# Patient Record
Sex: Male | Born: 1980 | Race: Black or African American | Hispanic: No | Marital: Single | Smoking: Current every day smoker
Health system: Southern US, Community
[De-identification: ages and names within clinical notes are randomized; demographics above are authoritative.]

## PROBLEM LIST (undated history)

## (undated) DIAGNOSIS — M5430 Sciatica, unspecified side: Secondary | ICD-10-CM

---

## 2007-11-18 ENCOUNTER — Emergency Department (HOSPITAL_COMMUNITY): Admission: EM | Admit: 2007-11-18 | Discharge: 2007-11-18 | Payer: Self-pay | Admitting: Emergency Medicine

## 2008-07-18 ENCOUNTER — Emergency Department (HOSPITAL_COMMUNITY): Admission: EM | Admit: 2008-07-18 | Discharge: 2008-07-18 | Payer: Self-pay | Admitting: Emergency Medicine

## 2008-07-20 ENCOUNTER — Emergency Department (HOSPITAL_COMMUNITY): Admission: EM | Admit: 2008-07-20 | Discharge: 2008-07-20 | Payer: Self-pay | Admitting: Emergency Medicine

## 2009-03-08 ENCOUNTER — Emergency Department (HOSPITAL_COMMUNITY): Admission: EM | Admit: 2009-03-08 | Discharge: 2009-03-08 | Payer: Self-pay | Admitting: Emergency Medicine

## 2009-03-10 ENCOUNTER — Emergency Department (HOSPITAL_COMMUNITY): Admission: EM | Admit: 2009-03-10 | Discharge: 2009-03-10 | Payer: Self-pay | Admitting: Emergency Medicine

## 2011-05-30 ENCOUNTER — Encounter (HOSPITAL_COMMUNITY): Payer: Self-pay | Admitting: Emergency Medicine

## 2011-05-30 ENCOUNTER — Emergency Department (HOSPITAL_COMMUNITY)
Admission: EM | Admit: 2011-05-30 | Discharge: 2011-05-30 | Disposition: A | Discharge status: Home / Self Care | Payer: Self-pay | Attending: Emergency Medicine | Admitting: Emergency Medicine

## 2011-05-30 DIAGNOSIS — L089 Local infection of the skin and subcutaneous tissue, unspecified: Secondary | ICD-10-CM | POA: Insufficient documentation

## 2011-05-30 DIAGNOSIS — F17200 Nicotine dependence, unspecified, uncomplicated: Secondary | ICD-10-CM | POA: Insufficient documentation

## 2011-05-30 MED ORDER — IBUPROFEN 600 MG PO TABS: 600.0000 mg | ORAL_TABLET | Freq: Four times a day (QID) | ORAL | Status: AC | PRN

## 2011-05-30 MED ORDER — DOXYCYCLINE HYCLATE 100 MG PO CAPS: 100.0000 mg | ORAL_CAPSULE | Freq: Two times a day (BID) | ORAL | Status: AC

## 2011-05-30 NOTE — ED Provider Notes (Signed)
History     CSN: [id number]  Arrival date & time 05/30/11  1635   First MD Initiated Contact with Patient 05/30/11 1703      Chief Complaint  Patient presents with  . Abscess    (Consider location/radiation/quality/duration/timing/severity/associated sxs/prior treatment) Patient is a 31 y.o. male presenting with abscess. The history is provided by the patient.  Abscess  The current episode started yesterday (He woke with a pimple on his left dorsal middle finger yesterday.  He squeezed the area,  a small amount of clear fluid was expressed,  now has increased pain.). The problem occurs continuously. The problem has been unchanged. The abscess is present on the left fingers. The problem is moderate. The abscess is characterized by redness, painfulness and swelling. Pertinent negatives include no fever and no sore throat. Associated symptoms comments: No numbness or weakness.. He has received no recent medical care.    History reviewed. No pertinent past medical history.  History reviewed. No pertinent past surgical history.  No family history on file.  History  Substance Use Topics  . Smoking status: Current Everyday Smoker    Types: Cigarettes  . Smokeless tobacco: Not on file  . Alcohol Use: Yes      Review of Systems  Constitutional: Negative for fever.  HENT: Negative.  Negative for sore throat.   Eyes: Negative.   Respiratory: Negative.   Cardiovascular: Negative.   Gastrointestinal: Negative.   Genitourinary: Negative.   Musculoskeletal: Negative for arthralgias.  Skin: Positive for wound. Negative for rash.  Neurological: Negative for weakness and numbness.  Hematological: Negative.   Psychiatric/Behavioral: Negative.     Allergies  Review of patient's allergies indicates no known allergies.  Home Medications   Current Outpatient Rx  Name Route Sig Dispense Refill  . DOXYCYCLINE HYCLATE 100 MG PO CAPS Oral Take 1 capsule (100 mg total) by mouth 2 (two)  times daily. 20 capsule 0  . IBUPROFEN 600 MG PO TABS Oral Take 1 tablet (600 mg total) by mouth every 6 (six) hours as needed for pain. 30 tablet 0    BP 130/77  Pulse 86  Temp(Src) 99.4 F (37.4 C) (Oral)  Resp 18  Ht 5\' 6"  (1.676 m)  Wt 160 lb (72.576 kg)  BMI 25.82 kg/m2  SpO2 99%  Physical Exam  Nursing note and vitals reviewed. Constitutional: He is oriented to person, place, and time. He appears well-developed and well-nourished.  HENT:  Head: Normocephalic and atraumatic.  Eyes: Conjunctivae are normal.  Neck: Normal range of motion.  Cardiovascular: Normal rate, regular rhythm and intact distal pulses.   Pulmonary/Chest: Effort normal.  Musculoskeletal: Normal range of motion.  Neurological: He is alert and oriented to person, place, and time.  Skin: Skin is warm and dry.       0.3 cm papule left dorsal mid proximal phalanx of left middle finger.  No red streaking,  No drainage or fluctuance.  No tenting.  Cap refill less than 3 sec.    Psychiatric: He has a normal mood and affect.    ED Course  Procedures (including critical care time)  Labs Reviewed - No data to display No results found.   1. Finger infection       MDM  Doxycyline.  Ibuprofen,  Epsom salt soaks.  Medical screening examination/treatment/procedure(s) were performed by non-physician practitioner and as supervising physician I was immediately available for consultation/collaboration. Osvaldo Human, M.D.       Candis Musa, PA 05/30/11 1729  Carleene Cooper III, MD 05/31/11 [phone]

## 2011-05-30 NOTE — Discharge Instructions (Signed)
Skin Infections A skin infection usually develops as a result of disruption of the skin barrier.  CAUSES  A skin infection might occur following:  Trauma or an injury to the skin such as a cut or insect sting.   Inflammation (as in eczema).   Breaks in the skin between the toes (as in athlete's foot).   Swelling (edema).  SYMPTOMS  The legs are the most common site affected. Usually there is:  Redness.   Swelling.   Pain.   There may be red streaks in the area of the infection.  TREATMENT   Minor skin infections may be treated with topical antibiotics, but if the skin infection is severe, hospital care and intravenous (IV) antibiotic treatment may be needed.   Most often skin infections can be treated with oral antibiotic medicine as well as proper rest and elevation of the affected area until the infection improves.   If you are prescribed oral antibiotics, it is important to take them as directed and to take all the pills even if you feel better before you have finished all of the medicine.   You may apply warm compresses to the area for 20-30 minutes 4 times daily.  You might need a tetanus shot now if:  You have no idea when you had the last one.   You have never had a tetanus shot before.   Your wound had dirt in it.  If you need a tetanus shot and you decide not to get one, there is a rare chance of getting tetanus. Sickness from tetanus can be serious. If you get a tetanus shot, your arm may swell and become red and warm at the shot site. This is common and not a problem. SEEK MEDICAL CARE IF:  The pain and swelling from your infection do not improve within 2 days.  SEEK IMMEDIATE MEDICAL CARE IF:  You develop a fever, chills, or other serious problems.  Document Released: 05/05/2004 Document Revised: 12/08/2010 Document Reviewed: 03/17/2008 [facility] Patient Information 2012 [place], [place].   Use the doxycycline until gone.  Use the ibuprofen for pain and  swelling.  Warm epsom salt soaks for 15 minutes 3-4 times daily.   Return here for a recheck for any worsened swelling or redness, as discussed.

## 2011-05-30 NOTE — ED Notes (Signed)
Red area to proximal phalanx LMF.  Started as a "pimple " that he "squeezed". After that area started to swell and cause pain up his arm.  Sl swelling to prox phalanx, no drainage.  No red streaks.

## 2011-05-30 NOTE — ED Notes (Signed)
Pt c/o abscess to the left middle finger.

## 2012-01-03 ENCOUNTER — Emergency Department (HOSPITAL_COMMUNITY)
Admission: EM | Admit: 2012-01-03 | Discharge: 2012-01-03 | Disposition: A | Discharge status: Home / Self Care | Payer: Self-pay | Attending: Emergency Medicine | Admitting: Emergency Medicine

## 2012-01-03 ENCOUNTER — Encounter (HOSPITAL_COMMUNITY): Payer: Self-pay

## 2012-01-03 DIAGNOSIS — IMO0002 Reserved for concepts with insufficient information to code with codable children: Secondary | ICD-10-CM | POA: Insufficient documentation

## 2012-01-03 DIAGNOSIS — F17200 Nicotine dependence, unspecified, uncomplicated: Secondary | ICD-10-CM | POA: Insufficient documentation

## 2012-01-03 DIAGNOSIS — M5416 Radiculopathy, lumbar region: Secondary | ICD-10-CM

## 2012-01-03 MED ORDER — PREDNISONE 50 MG PO TABS: 50.0000 mg | ORAL_TABLET | Freq: Every day | ORAL | Status: DC

## 2012-01-03 MED ORDER — OXYCODONE-ACETAMINOPHEN 5-325 MG PO TABS: ORAL_TABLET | ORAL | Status: DC

## 2012-01-03 MED ORDER — OXYCODONE-ACETAMINOPHEN 5-325 MG PO TABS
1.0000 | ORAL_TABLET | Freq: Once | ORAL | Status: AC
  Administered 2012-01-03: 1 via ORAL
  Filled 2012-01-03: qty 1

## 2012-01-03 MED ORDER — PREDNISONE 20 MG PO TABS
60.0000 mg | ORAL_TABLET | Freq: Once | ORAL | Status: AC
  Administered 2012-01-03: 60 mg via ORAL
  Filled 2012-01-03: qty 3

## 2012-01-03 NOTE — ED Notes (Signed)
Pt reports woke up with r hip pain Saturday morning.  Denies injury, says pain is getting worse each day.

## 2012-01-03 NOTE — ED Notes (Signed)
Alert, Nad, Pain rt hip, buttock since Saturday, No known injury. Says he has had problems with this back in the past.

## 2012-01-03 NOTE — ED Provider Notes (Signed)
History     CSN: [id number]  Arrival date & time 01/03/12  1229   First MD Initiated Contact with Patient 01/03/12 1321      Chief Complaint  Patient presents with  . Hip Pain    (Consider location/radiation/quality/duration/timing/severity/associated sxs/prior treatment) HPI Comments: Pt has chronic back pain.  He awakened a few days ago with "R hip pain".  He actually points to the R piriformis muscle area.  No known injury.  The history is provided by the patient. No language interpreter was used.    History reviewed. No pertinent past medical history.  History reviewed. No pertinent past surgical history.  No family history on file.  History  Substance Use Topics  . Smoking status: Current Every Day Smoker    Types: Cigarettes  . Smokeless tobacco: Not on file  . Alcohol Use: Yes      Review of Systems  Constitutional: Negative for fever and chills.  Musculoskeletal:       R buttock pain   Neurological: Negative for weakness and numbness.  All other systems reviewed and are negative.    Allergies  Onion and Peanut-containing drug products  Home Medications   Current Outpatient Rx  Name Route Sig Dispense Refill  . BIOFREEZE EX Apply externally Apply 1 application topically daily as needed. Pain    . OXYCODONE-ACETAMINOPHEN 5-325 MG PO TABS  One po q 4-6 hrs prn pain 20 tablet 0  . PREDNISONE 50 MG PO TABS Oral Take 1 tablet (50 mg total) by mouth daily. 6 tablet 0    BP 148/86  Pulse 69  Temp 98.2 F (36.8 C) (Oral)  Resp 20  Ht 5\' 7"  (1.702 m)  Wt 165 lb (74.844 kg)  BMI 25.84 kg/m2  SpO2 99%  Physical Exam  Nursing note and vitals reviewed. Constitutional: He is oriented to person, place, and time. He appears well-developed and well-nourished.  HENT:  Head: Normocephalic and atraumatic.  Eyes: EOM are normal.  Neck: Normal range of motion.  Cardiovascular: Normal rate, regular rhythm, normal heart sounds and intact distal pulses.     Pulmonary/Chest: Effort normal and breath sounds normal. No respiratory distress.  Abdominal: Soft. He exhibits no distension. There is no tenderness.  Musculoskeletal: He exhibits tenderness.       Back:  Neurological: He is alert and oriented to person, place, and time.  Skin: Skin is warm and dry.  Psychiatric: He has a normal mood and affect. Judgment normal.    ED Course  Procedures (including critical care time)  Labs Reviewed - No data to display No results found.   1. Lumbar radiculopathy, acute       MDM  ice rx-prednisone 50 mg, 6 rx-percocet, 20 F/u with dr. Hilda Lias prn        Evalina Field, PA 01/03/12 1353

## 2012-01-03 NOTE — ED Provider Notes (Signed)
Medical screening examination/treatment/procedure(s) were performed by non-physician practitioner and as supervising physician I was immediately available for consultation/collaboration.   Benny Lennert, MD 01/03/12 1539

## 2012-01-10 ENCOUNTER — Emergency Department (HOSPITAL_COMMUNITY)
Admission: EM | Admit: 2012-01-10 | Discharge: 2012-01-10 | Disposition: A | Discharge status: Home / Self Care | Payer: Self-pay | Attending: Emergency Medicine | Admitting: Emergency Medicine

## 2012-01-10 ENCOUNTER — Encounter (HOSPITAL_COMMUNITY): Payer: Self-pay | Admitting: *Deleted

## 2012-01-10 DIAGNOSIS — F17200 Nicotine dependence, unspecified, uncomplicated: Secondary | ICD-10-CM | POA: Insufficient documentation

## 2012-01-10 DIAGNOSIS — M5430 Sciatica, unspecified side: Secondary | ICD-10-CM | POA: Insufficient documentation

## 2012-01-10 LAB — URINALYSIS, ROUTINE W REFLEX MICROSCOPIC
Bilirubin Urine: NEGATIVE
Hgb urine dipstick: NEGATIVE
Protein, ur: NEGATIVE mg/dL
Specific Gravity, Urine: <1.005 — ABNORMAL LOW (ref 1.005–1.030)
Urobilinogen, UA: 0.2 mg/dL (ref 0.0–1.0)

## 2012-01-10 MED ORDER — HYDROMORPHONE HCL PF 1 MG/ML IJ SOLN
1.0000 mg | Freq: Once | INTRAMUSCULAR | Status: AC
  Administered 2012-01-10: 1 mg via INTRAMUSCULAR
  Filled 2012-01-10: qty 1

## 2012-01-10 MED ORDER — NAPROXEN 500 MG PO TABS: 500.0000 mg | ORAL_TABLET | Freq: Two times a day (BID) | ORAL | Status: DC

## 2012-01-10 MED ORDER — TRAMADOL HCL 50 MG PO TABS: 50.0000 mg | ORAL_TABLET | Freq: Four times a day (QID) | ORAL | Status: DC | PRN

## 2012-01-10 MED ORDER — ACETAMINOPHEN 325 MG PO TABS
650.0000 mg | ORAL_TABLET | Freq: Once | ORAL | Status: AC
  Administered 2012-01-10: 650 mg via ORAL
  Filled 2012-01-10: qty 2

## 2012-01-10 NOTE — ED Notes (Signed)
Pt c/o right hip and lower back pain x1 week. Pt was seen here previously but symptoms have gotten worse. Pt also presents with flu-like symptoms.

## 2012-01-10 NOTE — ED Notes (Addendum)
Pt reports being seen a week ago for hip and back pain.  Reports no relief in symptoms, despite medications.  Also reporting cold symptoms, generalized aches x2 days.
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
## 2012-01-22 ENCOUNTER — Encounter (HOSPITAL_COMMUNITY): Payer: Self-pay | Admitting: Emergency Medicine

## 2012-01-22 ENCOUNTER — Emergency Department (HOSPITAL_COMMUNITY)
Admission: EM | Admit: 2012-01-22 | Discharge: 2012-01-22 | Disposition: A | Discharge status: Home / Self Care | Payer: Self-pay | Attending: Emergency Medicine | Admitting: Emergency Medicine

## 2012-01-22 DIAGNOSIS — M5431 Sciatica, right side: Secondary | ICD-10-CM

## 2012-01-22 DIAGNOSIS — Z79899 Other long term (current) drug therapy: Secondary | ICD-10-CM | POA: Insufficient documentation

## 2012-01-22 DIAGNOSIS — M25579 Pain in unspecified ankle and joints of unspecified foot: Secondary | ICD-10-CM | POA: Insufficient documentation

## 2012-01-22 DIAGNOSIS — Z91018 Allergy to other foods: Secondary | ICD-10-CM | POA: Insufficient documentation

## 2012-01-22 DIAGNOSIS — M5430 Sciatica, unspecified side: Secondary | ICD-10-CM | POA: Insufficient documentation

## 2012-01-22 DIAGNOSIS — F17200 Nicotine dependence, unspecified, uncomplicated: Secondary | ICD-10-CM | POA: Insufficient documentation

## 2012-01-22 LAB — GLUCOSE, CAPILLARY

## 2012-01-22 MED ORDER — HYDROCODONE-ACETAMINOPHEN 5-325 MG PO TABS: 1.0000 | ORAL_TABLET | Freq: Four times a day (QID) | ORAL | Status: AC | PRN

## 2012-01-22 MED ORDER — PREDNISONE 20 MG PO TABS
60.0000 mg | ORAL_TABLET | Freq: Once | ORAL | Status: AC
  Administered 2012-01-22: 60 mg via ORAL
  Filled 2012-01-22: qty 3

## 2012-01-22 MED ORDER — HYDROCODONE-ACETAMINOPHEN 5-325 MG PO TABS
1.0000 | ORAL_TABLET | Freq: Once | ORAL | Status: AC
  Administered 2012-01-22: 1 via ORAL
  Filled 2012-01-22: qty 1

## 2012-01-22 NOTE — ED Provider Notes (Signed)
Medical screening examination/treatment/procedure(s) were performed by non-physician practitioner and as supervising physician I was immediately available for consultation/collaboration.  Vali Capano, MD 01/22/12 2349 

## 2012-01-22 NOTE — ED Provider Notes (Signed)
History     CSN: [id number]  Arrival date & time 01/22/12  1510   First MD Initiated Contact with Patient 01/22/12 1520      Chief Complaint  Patient presents with  . Back Pain    (Consider location/radiation/quality/duration/timing/severity/associated sxs/prior treatment) HPI Comments: Seen here several times with same complaint.  States he stopped taking the prednisone i wrote for him recently after taking 1 dose "because it was making things hurt that never hurt befre".  Now having radiation to R foot.    Wants orthopedic referral.  Patient is a 31 y.o. male presenting with back pain. The history is provided by the patient. No language interpreter was used.  Back Pain  This is a chronic problem. The problem occurs constantly. The problem has been gradually worsening. The pain is associated with no known injury. The pain is present in the lumbar spine. The quality of the pain is described as burning and aching. The pain radiates to the right foot. The pain is severe. The pain is the same all the time. Associated symptoms include numbness. Pertinent negatives include no fever and no weakness. Treatments tried: tramadol and naproxen. The treatment provided no relief.    History reviewed. No pertinent past medical history.  History reviewed. No pertinent past surgical history.  No family history on file.  History  Substance Use Topics  . Smoking status: Current Every Day Smoker    Types: Cigarettes  . Smokeless tobacco: Not on file  . Alcohol Use: Yes      Review of Systems  Constitutional: Negative for fever and chills.  Musculoskeletal: Positive for back pain.  Neurological: Positive for numbness. Negative for weakness.  All other systems reviewed and are negative.    Allergies  Onion and Peanut-containing drug products  Home Medications   Current Outpatient Rx  Name Route Sig Dispense Refill  . NAPROXEN 500 MG PO TABS Oral Take 1 tablet (500 mg total) by mouth  2 (two) times daily. 30 tablet 0  . TRAMADOL HCL 50 MG PO TABS Oral Take 1 tablet (50 mg total) by mouth every 6 (six) hours as needed for pain. 30 tablet 0  . HYDROCODONE-ACETAMINOPHEN 5-325 MG PO TABS Oral Take 1 tablet by mouth every 6 (six) hours as needed for pain. 20 tablet 0    BP 110/74  Pulse 62  Temp 98.4 F (36.9 C) (Oral)  Resp 20  Ht 5\' 7"  (1.702 m)  Wt 160 lb (72.576 kg)  BMI 25.06 kg/m2  SpO2 100%  Physical Exam  Nursing note and vitals reviewed. Constitutional: He is oriented to person, place, and time. He appears well-developed and well-nourished.  HENT:  Head: Normocephalic and atraumatic.  Eyes: EOM are normal.  Neck: Normal range of motion.  Cardiovascular: Normal rate, regular rhythm, normal heart sounds and intact distal pulses.   Pulmonary/Chest: Effort normal and breath sounds normal. No respiratory distress.  Abdominal: Soft. He exhibits no distension. There is no tenderness.  Musculoskeletal: Normal range of motion. He exhibits tenderness.       Back:  Neurological: He is alert and oriented to person, place, and time.  Skin: Skin is warm and dry.  Psychiatric: He has a normal mood and affect. Judgment normal.    ED Course  Procedures (including critical care time)   Labs Reviewed  GLUCOSE, CAPILLARY   No results found.   1. Right sided sciatica       MDM  rx-hydrocodone, 20 Has prednisone 50 mg  x 4 days Ice F/u with dr. Hilda Lias or dr. Romeo Apple.        Evalina Field, [place] 01/22/12 1615

## 2012-01-22 NOTE — ED Notes (Signed)
Pt c/o lower back pain radiating down right leg. H/s sciatica.

## 2012-04-07 ENCOUNTER — Emergency Department (HOSPITAL_COMMUNITY)
Admission: EM | Admit: 2012-04-07 | Discharge: 2012-04-07 | Disposition: A | Discharge status: Home / Self Care | Payer: Self-pay | Attending: Emergency Medicine | Admitting: Emergency Medicine

## 2012-04-07 ENCOUNTER — Encounter (HOSPITAL_COMMUNITY): Payer: Self-pay

## 2012-04-07 DIAGNOSIS — R209 Unspecified disturbances of skin sensation: Secondary | ICD-10-CM | POA: Insufficient documentation

## 2012-04-07 DIAGNOSIS — G8929 Other chronic pain: Secondary | ICD-10-CM | POA: Insufficient documentation

## 2012-04-07 DIAGNOSIS — M5416 Radiculopathy, lumbar region: Secondary | ICD-10-CM

## 2012-04-07 DIAGNOSIS — M5430 Sciatica, unspecified side: Secondary | ICD-10-CM | POA: Insufficient documentation

## 2012-04-07 DIAGNOSIS — IMO0002 Reserved for concepts with insufficient information to code with codable children: Secondary | ICD-10-CM | POA: Insufficient documentation

## 2012-04-07 DIAGNOSIS — F17200 Nicotine dependence, unspecified, uncomplicated: Secondary | ICD-10-CM | POA: Insufficient documentation

## 2012-04-07 HISTORY — DX: Sciatica, unspecified side: M54.30

## 2012-04-07 MED ORDER — CYCLOBENZAPRINE HCL 10 MG PO TABS: 10.0000 mg | ORAL_TABLET | Freq: Three times a day (TID) | ORAL | Status: DC | PRN

## 2012-04-07 MED ORDER — CYCLOBENZAPRINE HCL 10 MG PO TABS
10.0000 mg | ORAL_TABLET | Freq: Once | ORAL | Status: AC
  Administered 2012-04-07: 10 mg via ORAL
  Filled 2012-04-07: qty 1

## 2012-04-07 MED ORDER — HYDROCODONE-ACETAMINOPHEN 5-325 MG PO TABS
1.0000 | ORAL_TABLET | Freq: Once | ORAL | Status: AC
  Administered 2012-04-07: 1 via ORAL
  Filled 2012-04-07: qty 1

## 2012-04-07 MED ORDER — PREDNISONE 10 MG PO TABS: ORAL_TABLET | ORAL | Status: DC

## 2012-04-07 MED ORDER — HYDROCODONE-ACETAMINOPHEN 5-325 MG PO TABS: ORAL_TABLET | ORAL | Status: DC

## 2012-04-07 NOTE — ED Provider Notes (Signed)
History     CSN: [id number]  Arrival date & time 04/07/12  1443   First MD Initiated Contact with Patient 04/07/12 1701      Chief Complaint  Patient presents with  . Back Pain    (Consider location/radiation/quality/duration/timing/severity/associated sxs/prior treatment) HPI Comments: Patient c/o worsening of his chronic low back pain for several months.  States he has been diagnosed with sciatica and prescribed pain medication which he states he is out of.  States the back pain is worsening and describes having sharp, intermittent pains radiating down his right leg to his foot.  Pain is worse with certain movements.  He denies recent injury, incontinence, saddle anesthesia's, dysuria, or abdominal pain  Patient is a 31 y.o. male presenting with back pain. The history is provided by the patient.  Back Pain  This is a chronic problem. The current episode started more than 1 week ago. The problem occurs constantly. The problem has been gradually worsening. The pain is associated with lifting heavy objects and twisting. The pain is present in the lumbar spine. The quality of the pain is described as aching and shooting. The pain radiates to the right thigh, right knee and right foot. The pain is mild. The symptoms are aggravated by bending, twisting and certain positions. The pain is the same all the time. Associated symptoms include leg pain and tingling. Pertinent negatives include no chest pain, no fever, no numbness, no abdominal pain, no abdominal swelling, no bowel incontinence, no perianal numbness, no bladder incontinence, no dysuria, no pelvic pain, no paresthesias, no paresis and no weakness. He has tried analgesics for the symptoms. The treatment provided moderate relief.    Past Medical History  Diagnosis Date  . Chronic sciatica     History reviewed. No pertinent past surgical history.  No family history on file.  History  Substance Use Topics  . Smoking status: Current  Every Day Smoker    Types: Cigarettes  . Smokeless tobacco: Not on file  . Alcohol Use: Yes     Comment: occ      Review of Systems  Constitutional: Negative for fever.  Respiratory: Negative for shortness of breath.   Cardiovascular: Negative for chest pain.  Gastrointestinal: Negative for vomiting, abdominal pain, constipation and bowel incontinence.  Genitourinary: Negative for bladder incontinence, dysuria, urgency, hematuria, flank pain, decreased urine volume, discharge, difficulty urinating, penile pain, testicular pain and pelvic pain.       No perineal numbness or incontinence of urine or feces  Musculoskeletal: Positive for back pain. Negative for joint swelling.  Skin: Negative for rash.  Neurological: Positive for tingling. Negative for dizziness, weakness, numbness and paresthesias.  All other systems reviewed and are negative.    Allergies  Onion and Peanut-containing drug products  Home Medications   Current Outpatient Rx  Name  Route  Sig  Dispense  Refill  . NAPROXEN 500 MG PO TABS   Oral   Take 1 tablet (500 mg total) by mouth 2 (two) times daily.   30 tablet   0   . TRAMADOL HCL 50 MG PO TABS   Oral   Take 1 tablet (50 mg total) by mouth every 6 (six) hours as needed for pain.   30 tablet   0     BP 130/68  Pulse 88  Temp 98 F (36.7 C) (Oral)  Resp 18  Ht 5\' 6"  (1.676 m)  Wt 155 lb (70.308 kg)  BMI 25.02 kg/m2  SpO2 99%  Physical Exam  Nursing note and vitals reviewed. Constitutional: He is oriented to person, place, and time. He appears well-developed and well-nourished. No distress.  HENT:  Head: Normocephalic and atraumatic.  Mouth/Throat: Oropharynx is clear and moist.  Neck: Normal range of motion. Neck supple.  Cardiovascular: Normal rate, regular rhythm, normal heart sounds and intact distal pulses.   No murmur heard. Pulmonary/Chest: Effort normal and breath sounds normal. No respiratory distress. He exhibits no tenderness.    Abdominal: Soft. He exhibits no distension. There is no tenderness.  Musculoskeletal: Normal range of motion. He exhibits tenderness. He exhibits no edema.       Lumbar back: He exhibits tenderness and pain. He exhibits normal range of motion, no swelling, no deformity, no laceration and normal pulse.       Back:       ttp of the right lumbar paraspinal muscles and right si joint.  dp pulses are brisk, distal sensation intact.    Lymphadenopathy:    He has no cervical adenopathy.  Neurological: He is alert and oriented to person, place, and time. No sensory deficit. He exhibits normal muscle tone. Coordination and gait normal.  Reflex Scores:      Patellar reflexes are 2+ on the right side and 2+ on the left side.      Achilles reflexes are 2+ on the right side and 2+ on the left side. Skin: Skin is warm and dry.    ED Course  Procedures (including critical care time)  Labs Reviewed - No data to display No results found.      MDM    Previous ED chart reviewed.  Pt has ttp of the right lumbar paraspinal muscles and SI joint.  Pain reproduced with SLR on right.  No focal neuro deficits, ambulates with a steady gait.  Doubt emergent neurological or infectious process.  Pt given neuro referral.    Prescribed: Prednisone taper Flexeril norco #20  Lakeyta Vandenheuvel L. [place], [place] 04/09/12 2100

## 2012-04-07 NOTE — ED Notes (Signed)
Pt reports back pain off/on for years.  Afraid pain is going to get worse.  Out of meds for pain.  Since nov.

## 2012-04-09 NOTE — ED Provider Notes (Signed)
Medical screening examination/treatment/procedure(s) were performed by non-physician practitioner and as supervising physician I was immediately available for consultation/collaboration.  Kinzee Happel, MD 04/09/12 2112 

## 2012-05-21 ENCOUNTER — Other Ambulatory Visit (HOSPITAL_COMMUNITY): Payer: Self-pay | Admitting: Neurosurgery

## 2012-05-21 DIAGNOSIS — M545 Low back pain: Secondary | ICD-10-CM

## 2012-05-24 ENCOUNTER — Ambulatory Visit (HOSPITAL_COMMUNITY): Payer: Self-pay

## 2012-05-30 ENCOUNTER — Ambulatory Visit (HOSPITAL_COMMUNITY)
Admission: RE | Admit: 2012-05-30 | Discharge: 2012-05-30 | Disposition: A | Discharge status: Home / Self Care | Payer: Self-pay | Source: Ambulatory Visit | Attending: Neurosurgery | Admitting: Neurosurgery

## 2012-05-30 DIAGNOSIS — M5126 Other intervertebral disc displacement, lumbar region: Secondary | ICD-10-CM | POA: Insufficient documentation

## 2012-05-30 DIAGNOSIS — R209 Unspecified disturbances of skin sensation: Secondary | ICD-10-CM | POA: Insufficient documentation

## 2012-05-30 DIAGNOSIS — M545 Low back pain, unspecified: Secondary | ICD-10-CM | POA: Insufficient documentation

## 2012-08-24 ENCOUNTER — Encounter (HOSPITAL_COMMUNITY): Payer: Self-pay | Admitting: *Deleted

## 2012-08-24 ENCOUNTER — Emergency Department (HOSPITAL_COMMUNITY)
Admission: EM | Admit: 2012-08-24 | Discharge: 2012-08-24 | Disposition: A | Discharge status: Home / Self Care | Payer: Self-pay | Attending: Emergency Medicine | Admitting: Emergency Medicine

## 2012-08-24 DIAGNOSIS — Z79899 Other long term (current) drug therapy: Secondary | ICD-10-CM | POA: Insufficient documentation

## 2012-08-24 DIAGNOSIS — F17200 Nicotine dependence, unspecified, uncomplicated: Secondary | ICD-10-CM | POA: Insufficient documentation

## 2012-08-24 DIAGNOSIS — Z8739 Personal history of other diseases of the musculoskeletal system and connective tissue: Secondary | ICD-10-CM | POA: Insufficient documentation

## 2012-08-24 DIAGNOSIS — K089 Disorder of teeth and supporting structures, unspecified: Secondary | ICD-10-CM | POA: Insufficient documentation

## 2012-08-24 DIAGNOSIS — K0889 Other specified disorders of teeth and supporting structures: Secondary | ICD-10-CM

## 2012-08-24 MED ORDER — DICLOFENAC SODIUM 75 MG PO TBEC: 75.0000 mg | DELAYED_RELEASE_TABLET | Freq: Two times a day (BID) | ORAL | Status: AC

## 2012-08-24 MED ORDER — HYDROCODONE-ACETAMINOPHEN 7.5-325 MG PO TABS: 1.0000 | ORAL_TABLET | ORAL | Status: DC | PRN

## 2012-08-24 NOTE — ED Provider Notes (Signed)
History     CSN: [id number]  Arrival date & time 08/24/12  1059   First MD Initiated Contact with Patient 08/24/12 1218      Chief Complaint  Patient presents with  . Dental Pain    (Consider location/radiation/quality/duration/timing/severity/associated sxs/prior treatment) Patient is a 32 y.o. male presenting with tooth pain. The history is provided by the patient.  Dental PainThe primary symptoms include mouth pain. Primary symptoms do not include shortness of breath or cough. The symptoms are worsening. The symptoms occur frequently.  Additional symptoms include: gum swelling and jaw pain. Additional symptoms do not include: trouble swallowing and nosebleeds. Medical issues include: smoking.    Past Medical History  Diagnosis Date  . Chronic sciatica     History reviewed. No pertinent past surgical history.  History reviewed. No pertinent family history.  History  Substance Use Topics  . Smoking status: Current Every Day Smoker    Types: Cigarettes  . Smokeless tobacco: Not on file  . Alcohol Use: Yes     Comment: occ      Review of Systems  Constitutional: Negative for activity change.       All ROS Neg except as noted in HPI  HENT: Positive for dental problem. Negative for nosebleeds, trouble swallowing and neck pain.   Eyes: Negative for photophobia and discharge.  Respiratory: Negative for cough, shortness of breath and wheezing.   Cardiovascular: Negative for chest pain and palpitations.  Gastrointestinal: Negative for abdominal pain and blood in stool.  Genitourinary: Negative for dysuria, frequency and hematuria.  Musculoskeletal: Negative for back pain and arthralgias.  Skin: Negative.   Neurological: Negative for dizziness, seizures and speech difficulty.  Psychiatric/Behavioral: Negative for hallucinations and confusion.    Allergies  Onion and Peanut-containing drug products  Home Medications   Current Outpatient Rx  Name  Route  Sig   Dispense  Refill  . HYDROcodone-acetaminophen (NORCO/VICODIN) 5-325 MG per tablet   Oral   Take 1 tablet by mouth every 6 (six) hours as needed for pain.         Marland Kitchen penicillin v potassium (VEETID) 500 MG tablet   Oral   Take 500 mg by mouth 4 (four) times daily.           BP 132/82  Pulse 91  Temp(Src) 98.6 F (37 C) (Oral)  Resp 18  Ht 5\' 6"  (1.676 m)  Wt 150 lb (68.04 kg)  BMI 24.22 kg/m2  SpO2 100%  Physical Exam  Nursing note and vitals reviewed. Constitutional: He is oriented to person, place, and time. He appears well-developed and well-nourished.  Non-toxic appearance.  HENT:  Head: Normocephalic.  Right Ear: Tympanic membrane and external ear normal.  Left Ear: Tympanic membrane and external ear normal.  Deep cavity of the right lower 1st molar. Mod swelling of the gum on the right.  No swelling under the tongue. Airway patent.  Eyes: EOM and lids are normal. Pupils are equal, round, and reactive to light.  Neck: Normal range of motion. Neck supple. Carotid bruit is not present.  Cardiovascular: Normal rate, regular rhythm, normal heart sounds, intact distal pulses and normal pulses.   Pulmonary/Chest: Breath sounds normal. No respiratory distress.  Abdominal: Soft. Bowel sounds are normal. There is no tenderness. There is no guarding.  Musculoskeletal: Normal range of motion.  Lymphadenopathy:       Head (right side): No submandibular adenopathy present.       Head (left side): No submandibular adenopathy present.  He has no cervical adenopathy.  Neurological: He is alert and oriented to person, place, and time. He has normal strength. No cranial nerve deficit or sensory deficit.  Skin: Skin is warm and dry.  Psychiatric: He has a normal mood and affect. His speech is normal.    ED Course  Procedures (including critical care time)  Labs Reviewed - No data to display No results found.   No diagnosis found.    MDM  I have reviewed nursing notes,  vital signs, and all appropriate lab and imaging results for this patient. Pt has a cavity that has been bothering him for a while. He is scheduled to have the tooth removed on Wed. 5/21. He states the pain is worse and request assistance with pain.  Rx for diclofenac and norco 7.5 given to the patient. Pt states he has penicillin 500mg  tabs.     Kathie Dike, PA-C 08/24/12 1233

## 2012-08-24 NOTE — ED Notes (Signed)
Dental pain for 3 days,  Supposed to have it pulled on Wednesday.

## 2012-08-27 NOTE — ED Provider Notes (Signed)
Medical screening examination/treatment/procedure(s) were performed by non-physician practitioner and as supervising physician I was immediately available for consultation/collaboration.   Kennen Stammer, MD 08/27/12 2207 

## 2013-04-12 ENCOUNTER — Emergency Department (HOSPITAL_COMMUNITY): Payer: Self-pay

## 2013-04-12 ENCOUNTER — Emergency Department (HOSPITAL_COMMUNITY)
Admission: EM | Admit: 2013-04-12 | Discharge: 2013-04-12 | Disposition: A | Discharge status: Home / Self Care | Payer: Self-pay | Attending: Emergency Medicine | Admitting: Emergency Medicine

## 2013-04-12 ENCOUNTER — Encounter (HOSPITAL_COMMUNITY): Payer: Self-pay | Admitting: Emergency Medicine

## 2013-04-12 DIAGNOSIS — F17200 Nicotine dependence, unspecified, uncomplicated: Secondary | ICD-10-CM | POA: Insufficient documentation

## 2013-04-12 DIAGNOSIS — Z792 Long term (current) use of antibiotics: Secondary | ICD-10-CM | POA: Insufficient documentation

## 2013-04-12 DIAGNOSIS — Z79899 Other long term (current) drug therapy: Secondary | ICD-10-CM | POA: Insufficient documentation

## 2013-04-12 DIAGNOSIS — Z8739 Personal history of other diseases of the musculoskeletal system and connective tissue: Secondary | ICD-10-CM | POA: Insufficient documentation

## 2013-04-12 DIAGNOSIS — R109 Unspecified abdominal pain: Secondary | ICD-10-CM

## 2013-04-12 DIAGNOSIS — R1012 Left upper quadrant pain: Secondary | ICD-10-CM | POA: Insufficient documentation

## 2013-04-12 LAB — CBC WITH DIFFERENTIAL/PLATELET
BASOS ABS: 0 10*3/uL (ref 0.0–0.1)
Basophils Relative: 0 % (ref 0–1)
EOS ABS: 0.1 10*3/uL (ref 0.0–0.7)
EOS PCT: 2 % (ref 0–5)
HCT: 42.6 % (ref 39.0–52.0)
Hemoglobin: 14.7 g/dL (ref 13.0–17.0)
LYMPHS ABS: 2.2 10*3/uL (ref 0.7–4.0)
LYMPHS PCT: 41 % (ref 12–46)
MCH: 32 pg (ref 26.0–34.0)
MCHC: 34.5 g/dL (ref 30.0–36.0)
MCV: 92.8 fL (ref 78.0–100.0)
Monocytes Absolute: 0.7 10*3/uL (ref 0.1–1.0)
Monocytes Relative: 13 % — ABNORMAL HIGH (ref 3–12)
NEUTROS PCT: 44 % (ref 43–77)
Neutro Abs: 2.4 10*3/uL (ref 1.7–7.7)
PLATELETS: 326 10*3/uL (ref 150–400)
RBC: 4.59 MIL/uL (ref 4.22–5.81)
RDW: 12.9 % (ref 11.5–15.5)
WBC: 5.5 10*3/uL (ref 4.0–10.5)

## 2013-04-12 LAB — COMPREHENSIVE METABOLIC PANEL
ALBUMIN: 4.1 g/dL (ref 3.5–5.2)
ALK PHOS: 98 U/L (ref 39–117)
ALT: 42 U/L (ref 0–53)
AST: 47 U/L — AB (ref 0–37)
BUN: 15 mg/dL (ref 6–23)
CALCIUM: 9.4 mg/dL (ref 8.4–10.5)
CO2: 23 mEq/L (ref 19–32)
Chloride: 104 mEq/L (ref 96–112)
Creatinine, Ser: 1.28 mg/dL (ref 0.50–1.35)
GFR calc Af Amer: 83 mL/min — ABNORMAL LOW (ref >90)
GFR calc non Af Amer: 72 mL/min — ABNORMAL LOW (ref >90)
Glucose, Bld: 90 mg/dL (ref 70–99)
POTASSIUM: 4 meq/L (ref 3.7–5.3)
SODIUM: 139 meq/L (ref 137–147)
TOTAL PROTEIN: 7.7 g/dL (ref 6.0–8.3)
Total Bilirubin: 0.5 mg/dL (ref 0.3–1.2)

## 2013-04-12 LAB — LIPASE, BLOOD: Lipase: 23 U/L (ref 11–59)

## 2013-04-12 MED ORDER — SODIUM CHLORIDE 0.9 % IV BOLUS (SEPSIS)
1000.0000 mL | Freq: Once | INTRAVENOUS | Status: AC
  Administered 2013-04-12: 1000 mL via INTRAVENOUS

## 2013-04-12 MED ORDER — OMEPRAZOLE 20 MG PO CPDR: 20.0000 mg | DELAYED_RELEASE_CAPSULE | Freq: Every day | ORAL | Status: DC

## 2013-04-12 MED ORDER — HYDROCODONE-ACETAMINOPHEN 5-325 MG PO TABS: 1.0000 | ORAL_TABLET | Freq: Four times a day (QID) | ORAL | Status: DC | PRN

## 2013-04-12 MED ORDER — DIPHENHYDRAMINE HCL 50 MG/ML IJ SOLN
25.0000 mg | Freq: Once | INTRAMUSCULAR | Status: AC
  Administered 2013-04-12: 25 mg via INTRAVENOUS
  Filled 2013-04-12: qty 1

## 2013-04-12 MED ORDER — MORPHINE SULFATE 4 MG/ML IJ SOLN
4.0000 mg | Freq: Once | INTRAMUSCULAR | Status: AC
  Administered 2013-04-12: 4 mg via INTRAVENOUS
  Filled 2013-04-12: qty 1

## 2013-04-12 NOTE — Discharge Instructions (Signed)

## 2013-04-12 NOTE — ED Notes (Signed)
Pt c/o LUQ pain x 1 week.  Denies n/v/d.

## 2013-04-12 NOTE — ED Provider Notes (Signed)
Medical screening examination/treatment/procedure(s) were conducted as a shared visit with non-physician practitioner(s) and myself.  I personally evaluated the patient during the encounter.  EKG Interpretation   None      No acute abdomen. White count normal. Ultrasound negative.  Donnetta HutchingBrian Cheila Wickstrom, MD 04/12/13 1401

## 2013-04-12 NOTE — ED Provider Notes (Signed)
CSN: [id number]     Arrival date & time 04/12/13  1191 History   First MD Initiated Contact with Patient 04/12/13 [phone]     Chief Complaint  Patient presents with  . Abdominal Pain   (Consider location/radiation/quality/duration/timing/severity/associated sxs/prior Treatment) HPI Comments: Patient presents emergency department with chief complaint of left upper quadrant pain times one week. He denies any associated nausea, vomiting, or diarrhea. He denies any dysuria, or hematuria. He states that his pain is 9/10. States that sometimes radiates to his back. He states that he drinks a sixpack of beer per day. He denies any other health problems. He states that he is especially concerned, because his aunt recently died, prior to her death she was having left upper quadrant abdominal pain. He does not know what the cause of death was.  The history is provided by the patient. No language interpreter was used.    Past Medical History  Diagnosis Date  . Chronic sciatica    History reviewed. No pertinent past surgical history. No family history on file. History  Substance Use Topics  . Smoking status: Current Every Day Smoker    Types: Cigarettes  . Smokeless tobacco: Not on file  . Alcohol Use: Yes     Comment: occ    Review of Systems  All other systems reviewed and are negative.    Allergies  Onion and Peanut-containing drug products  Home Medications   Current Outpatient Rx  Name  Route  Sig  Dispense  Refill  . diclofenac (VOLTAREN) 75 MG EC tablet   Oral   Take 1 tablet (75 mg total) by mouth 2 (two) times daily.   12 tablet   0   . HYDROcodone-acetaminophen (NORCO) 7.5-325 MG per tablet   Oral   Take 1 tablet by mouth every 4 (four) hours as needed for pain.   20 tablet   0   . HYDROcodone-acetaminophen (NORCO/VICODIN) 5-325 MG per tablet   Oral   Take 1 tablet by mouth every 6 (six) hours as needed for pain.         Marland Kitchen penicillin v potassium (VEETID) 500 MG  tablet   Oral   Take 500 mg by mouth 4 (four) times daily.          BP 132/76  Pulse 77  Temp(Src) 98.1 F (36.7 C) (Oral)  Resp 18  Ht 5\' 6"  (1.676 m)  Wt 160 lb (72.576 kg)  BMI 25.84 kg/m2  SpO2 98% Physical Exam  Nursing note and vitals reviewed. Constitutional: He is oriented to person, place, and time. He appears well-developed and well-nourished.  HENT:  Head: Normocephalic and atraumatic.  Eyes: Conjunctivae and EOM are normal. Pupils are equal, round, and reactive to light. Right eye exhibits no discharge. Left eye exhibits no discharge. No scleral icterus.  Neck: Normal range of motion. Neck supple. No JVD present.  Cardiovascular: Normal rate, regular rhythm and normal heart sounds.  Exam reveals no gallop and no friction rub.   No murmur heard. Pulmonary/Chest: Effort normal and breath sounds normal. No respiratory distress. He has no wheezes. He has no rales. He exhibits no tenderness.  Abdominal: Soft. Bowel sounds are normal. He exhibits no distension and no mass. There is tenderness. There is guarding. There is no rebound.  Left upper quadrant is mildly tender to palpation, patient states that he can also feel some tenderness in his epigastrium, no other focal abdominal tenderness, the patient does guard  Musculoskeletal: Normal range of motion.  He exhibits no edema and no tenderness.  Neurological: He is alert and oriented to person, place, and time.  Skin: Skin is warm and dry.  Psychiatric: He has a normal mood and affect. His behavior is normal. Judgment and thought content normal.    ED Course  Procedures (including critical care time) Results for orders placed during the hospital encounter of 04/12/13  CBC WITH DIFFERENTIAL      Result Value Range   WBC 5.5  4.0 - 10.5 K/uL   RBC 4.59  4.22 - 5.81 MIL/uL   Hemoglobin 14.7  13.0 - 17.0 g/dL   HCT 81.142.6  91.439.0 - 78.252.0 %   MCV 92.8  78.0 - 100.0 fL   MCH 32.0  26.0 - 34.0 pg   MCHC 34.5  30.0 - 36.0 g/dL    RDW 95.612.9  21.311.5 - 08.615.5 %   Platelets 326  150 - 400 K/uL   Neutrophils Relative % 44  43 - 77 %   Neutro Abs 2.4  1.7 - 7.7 K/uL   Lymphocytes Relative 41  12 - 46 %   Lymphs Abs 2.2  0.7 - 4.0 K/uL   Monocytes Relative 13 (*) 3 - 12 %   Monocytes Absolute 0.7  0.1 - 1.0 K/uL   Eosinophils Relative 2  0 - 5 %   Eosinophils Absolute 0.1  0.0 - 0.7 K/uL   Basophils Relative 0  0 - 1 %   Basophils Absolute 0.0  0.0 - 0.1 K/uL  COMPREHENSIVE METABOLIC PANEL      Result Value Range   Sodium 139  137 - 147 mEq/L   Potassium 4.0  3.7 - 5.3 mEq/L   Chloride 104  96 - 112 mEq/L   CO2 23  19 - 32 mEq/L   Glucose, Bld 90  70 - 99 mg/dL   BUN 15  6 - 23 mg/dL   Creatinine, Ser 5.781.28  0.50 - 1.35 mg/dL   Calcium 9.4  8.4 - 46.910.5 mg/dL   Total Protein 7.7  6.0 - 8.3 g/dL   Albumin 4.1  3.5 - 5.2 g/dL   AST 47 (*) 0 - 37 U/L   ALT 42  0 - 53 U/L   Alkaline Phosphatase 98  39 - 117 U/L   Total Bilirubin 0.5  0.3 - 1.2 mg/dL   GFR calc non Af Amer 72 (*) >90 mL/min   GFR calc Af Amer 83 (*) >90 mL/min  LIPASE, BLOOD      Result Value Range   Lipase 23  11 - 59 U/L   [place] Abdomen Complete  04/12/2013   CLINICAL DATA:  Left upper quadrant pain.  EXAM: ULTRASOUND ABDOMEN COMPLETE  COMPARISON:  None.  FINDINGS: Gallbladder:  No gallstones or wall thickening visualized. No sonographic Murphy sign noted.  Common bile duct:  Diameter: 2.2 mm.  Liver:  No focal lesion identified. Within normal limits in parenchymal echogenicity.  IVC:  No abnormality visualized.  Pancreas:  Visualized portion unremarkable.  Spleen:  Size and appearance within normal limits.  Right Kidney:  Length: 10.6 cm. Echogenicity within normal limits. No mass or hydronephrosis visualized.  Left Kidney:  Length: 11.0 cm. Echogenicity within normal limits. No mass or hydronephrosis visualized.  Abdominal aorta:  No aneurysm visualized.  Other findings:  None.  IMPRESSION: Normal abdominal ultrasound.   Electronically Signed   By: Elberta Fortisaniel   Boyle M.D.   On: 04/12/2013 11:12      EKG Interpretation  None       MDM   1. Abdominal pain     Patient with upper abdominal pain times one week. Drinks approximately 1 sixpack per day. Will check basic labs, will give fluids, pain medicine. Will reevaluate.  11:13 AM Patient seen by and discussed with Dr. Adriana Simas, who says that the patient can go home.  Labs and imaging are reassuring.  Will advise the patient to lay off the etoh, and follow up with PCP.  Abdomen is soft and benign.    Roxy Horseman, PA-C 04/12/13 1135

## 2013-12-06 ENCOUNTER — Emergency Department (HOSPITAL_COMMUNITY)
Admission: EM | Admit: 2013-12-06 | Discharge: 2013-12-06 | Disposition: A | Discharge status: Home / Self Care | Payer: Self-pay | Attending: Emergency Medicine | Admitting: Emergency Medicine

## 2013-12-06 ENCOUNTER — Encounter (HOSPITAL_COMMUNITY): Payer: Self-pay | Admitting: Emergency Medicine

## 2013-12-06 DIAGNOSIS — M25569 Pain in unspecified knee: Secondary | ICD-10-CM | POA: Insufficient documentation

## 2013-12-06 DIAGNOSIS — M545 Low back pain, unspecified: Secondary | ICD-10-CM | POA: Insufficient documentation

## 2013-12-06 DIAGNOSIS — IMO0002 Reserved for concepts with insufficient information to code with codable children: Secondary | ICD-10-CM | POA: Insufficient documentation

## 2013-12-06 DIAGNOSIS — F17200 Nicotine dependence, unspecified, uncomplicated: Secondary | ICD-10-CM | POA: Insufficient documentation

## 2013-12-06 DIAGNOSIS — Z79899 Other long term (current) drug therapy: Secondary | ICD-10-CM | POA: Insufficient documentation

## 2013-12-06 DIAGNOSIS — G8929 Other chronic pain: Secondary | ICD-10-CM

## 2013-12-06 DIAGNOSIS — M5416 Radiculopathy, lumbar region: Secondary | ICD-10-CM

## 2013-12-06 MED ORDER — NAPROXEN 500 MG PO TABS: 500.0000 mg | ORAL_TABLET | Freq: Two times a day (BID) | ORAL | Status: DC

## 2013-12-06 MED ORDER — METHOCARBAMOL 500 MG PO TABS: 500.0000 mg | ORAL_TABLET | Freq: Two times a day (BID) | ORAL | Status: DC

## 2013-12-06 MED ORDER — HYDROCODONE-ACETAMINOPHEN 5-325 MG PO TABS: 1.0000 | ORAL_TABLET | Freq: Four times a day (QID) | ORAL | Status: DC | PRN

## 2013-12-06 MED ORDER — HYDROCODONE-ACETAMINOPHEN 5-325 MG PO TABS
1.0000 | ORAL_TABLET | Freq: Four times a day (QID) | ORAL | Status: DC | PRN
Start: 2013-12-06 — End: 2014-11-06

## 2013-12-06 NOTE — ED Provider Notes (Signed)
  Medical screening examination/treatment/procedure(s) were performed by non-physician practitioner and as supervising physician I was immediately available for consultation/collaboration.   EKG Interpretation None         Gerhard Munch, MD 12/06/13 [phone]

## 2013-12-06 NOTE — ED Notes (Signed)
Pt c/o low back pain and B/L knee pain onset yesterday. Pt has been sleeping in a recliner here in the hospital for past 3 days. Pt has history of sciatica and knee pain.

## 2013-12-06 NOTE — Discharge Instructions (Signed)
Followup with orthopedics if symptoms continue. Use conservative methods at home including heat therapy and cold therapy as we discussed. More information on cold therapy is listed below.  It is not recommended to use heat treatment directly after an acute injury.  Take pain medication and muscle relaxer as needed for pain.  Do not drive or operate heavy machinery for 4-6 hours after taking medication.  SEEK IMMEDIATE MEDICAL ATTENTION IF: New numbness, tingling, weakness, or problem with the use of your arms or legs.  Severe back pain not relieved with medications.  Change in bowel or bladder control.  Increasing pain in any areas of the body (such as chest or abdominal pain).  Shortness of breath, dizziness or fainting.  Nausea (feeling sick to your stomach), vomiting, fever, or sweats.  COLD THERAPY DIRECTIONS:  Ice or gel packs can be used to reduce both pain and swelling. Ice is the most helpful within the first 24 to 48 hours after an injury or flareup from overusing a muscle or joint.  Ice is effective, has very few side effects, and is safe for most people to use.   If you expose your skin to cold temperatures for too long or without the proper protection, you can damage your skin or nerves. Watch for signs of skin damage due to cold.   HOME CARE INSTRUCTIONS  Follow these tips to use ice and cold packs safely.  Place a dry or damp towel between the ice and skin. A damp towel will cool the skin more quickly, so you may need to shorten the time that the ice is used.  For a more rapid response, add gentle compression to the ice.  Ice for no more than 10 to 20 minutes at a time. The bonier the area you are icing, the less time it will take to get the benefits of ice.  Check your skin after 5 minutes to make sure there are no signs of a poor response to cold or skin damage.  Rest 20 minutes or more in between uses.  Once your skin is numb, you can end your treatment. You can test numbness  by very lightly touching your skin. The touch should be so light that you do not see the skin dimple from the pressure of your fingertip. When using ice, most people will feel these normal sensations in this order: cold, burning, aching, and numbness.  Do not use ice on someone who cannot communicate their responses to pain, such as small children or people with dementia.   HOW TO MAKE AN ICE PACK  To make an ice pack, do one of the following:  Place crushed ice or a bag of frozen vegetables in a sealable plastic bag. Squeeze out the excess air. Place this bag inside another plastic bag. Slide the bag into a pillowcase or place a damp towel between your skin and the bag.  Mix 3 parts water with 1 part rubbing alcohol. Freeze the mixture in a sealable plastic bag. When you remove the mixture from the freezer, it will be slushy. Squeeze out the excess air. Place this bag inside another plastic bag. Slide the bag into a pillowcase or place a damp towel between your skin and the bag.   SEEK MEDICAL CARE IF:  You develop white spots on your skin. This may give the skin a blotchy (mottled) appearance.  Your skin turns blue or pale.  Your skin becomes waxy or hard.  Your swelling gets worse.  MAKE SURE YOU:  °Understand these instructions.  °Will watch your condition.  °Will get help right away if you are not doing well or get worse.  ° ° °Chronic Pain Discharge Instructions  °Emergency care providers appreciate that many patients coming to us are in severe pain and we wish to address their pain in the safest, most responsible manner.  It is important to recognize however, that the proper treatment of chronic pain differs from that of the pain of injuries and acute illnesses.  Our goal is to provide quality, safe, personalized care and we thank you for giving us the opportunity to serve you. °The use of narcotics and related agents for chronic pain syndromes may lead to additional physical and psychological  problems.  Nearly as many people die from prescription narcotics each year as die from car crashes.  Additionally, this risk is increased if such prescriptions are obtained from a variety of sources.  Therefore, only your primary care physician or a pain management specialist is able to safely treat such syndromes with narcotic medications long-term.   ° °Documentation revealing such prescriptions have been sought from multiple sources may prohibit us from providing a refill or different narcotic medication.  Your name may be checked first through the [employer] Controlled Substances Reporting System.  This database is a record of controlled substance medication prescriptions that the patient has received.  This has been established by [facility] in an effort to eliminate the dangerous, and often life threatening, practice of obtaining multiple prescriptions from different medical providers.  ° °If you have a chronic pain syndrome (i.e. chronic headaches, recurrent back or neck pain, dental pain, abdominal or pelvis pain without a specific diagnosis, or neuropathic pain such as fibromyalgia) or recurrent visits for the same condition without an acute diagnosis, you may be treated with non-narcotics and other non-addictive medicines.  Allergic reactions or negative side effects that may be reported by a patient to such medications will not typically lead to the use of a narcotic analgesic or other controlled substance as an alternative. °  °Patients managing chronic pain with a personal physician should have provisions in place for breakthrough pain.  If you are in crisis, you should call your physician.  If your physician directs you to the emergency department, please have the doctor call and speak to our attending physician concerning your care. °  °When patients come to the Emergency Department (ED) with acute medical conditions in which the Emergency Department physician feels appropriate to prescribe  narcotic or sedating pain medication, the physician will prescribe these in very limited quantities.  The amount of these medications will last only until you can see your primary care physician in his/her office.  Any patient who returns to the ED seeking refills should expect only non-narcotic pain medications.  ° °In the event of an acute medical condition exists and the emergency physician feels it is necessary that the patient be given a narcotic or sedating medication -  a responsible adult driver should be present in the room prior to the medication being given by the nurse. °  °Prescriptions for narcotic or sedating medications that have been lost, stolen or expired will not be refilled in the Emergency Department.   ° °Patients who have chronic pain may receive non-narcotic prescriptions until seen by their primary care physician.  It is every patient’s personal responsibility to maintain active prescriptions with his or her primary care physician or specialist. ° ° ° °

## 2013-12-06 NOTE — ED Provider Notes (Signed)
CSN: [id number]     Arrival date & time 12/06/13  4098 History   First MD Initiated Contact with Patient 12/06/13 0745     Chief Complaint  Patient presents with  . Back Pain  . Knee Pain     (Consider location/radiation/quality/duration/timing/severity/associated sxs/prior Treatment) HPI Comments: Patient with a history of Chronic Sciatica presents today with lower back pain.  Pain radiates down his right leg.  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  He states that he has been seen by Neurosurgery in the past and was recommended to have surgery, but did not want to have surgery.   He reports that his pain has been worse for the past 2 days after sleeping on a recliner while visiting his grandmother in the hospital.  He reports associated numbness of his right leg, which he reports is also chronic.  He denies any acute injury or trauma.  He denies fever, chills, abdominal pain, or bowel/bladder incontinence.  He has not been taking anything for pain.  Denies any prior history of Cancer or IVDU.  He is also complaining of bilateral knee pain.  He reports that this pain has also been present for years, but worsened over the past couple of days.  Denies any acute injury or trauma to the knees.  Denies any swelling or erythema of the knees.  The history is provided by the patient.    Past Medical History  Diagnosis Date  . Chronic sciatica    History reviewed. No pertinent past surgical history. No family history on file. History  Substance Use Topics  . Smoking status: Current Every Day Smoker    Types: Cigarettes  . Smokeless tobacco: Not on file  . Alcohol Use: Yes     Comment: occ    Review of Systems  All other systems reviewed and are negative.     Allergies  Onion and Peanut-containing drug products  Home Medications   Prior to Admission medications   Medication Sig Start Date End Date Taking? Authorizing  Provider  Cyanocobalamin (VITAMIN B-12 PO) Take 1 tablet by mouth daily.    Historical Provider, MD  HYDROcodone-acetaminophen (NORCO/VICODIN) 5-325 MG per tablet Take 1-2 tablets by mouth every 6 (six) hours as needed. 04/12/13   Roxy Horseman, PA-C  omeprazole (PRILOSEC) 20 MG capsule Take 1 capsule (20 mg total) by mouth daily. 04/12/13   Roxy Horseman, PA-C   BP 128/85  Pulse 60  Temp(Src) 98.3 F (36.8 C) (Oral)  Resp 18  Ht  (1.676 m)  Wt 165 lb (74.844 kg)  BMI 26.64 kg/m2  SpO2 99% Physical Exam  Nursing note and vitals reviewed. Constitutional: He appears well-developed and well-nourished.  HENT:  Head: Normocephalic and atraumatic.  Neck: Normal range of motion. Neck supple.  Cardiovascular: Normal rate, regular rhythm and normal heart sounds.   Pulmonary/Chest: Effort normal and breath sounds normal.  Musculoskeletal:       Right knee: He exhibits normal range of motion, no swelling, no effusion and no erythema. Tenderness found.       Left knee: He exhibits normal range of motion, no swelling, no effusion and no erythema. Tenderness found.       Cervical back: He exhibits normal range of motion, no tenderness, no bony tenderness, no swelling, no edema and no deformity.       Thoracic back: He exhibits normal range of motion, no tenderness, no bony tenderness, no  swelling, no edema and no deformity.       Lumbar back: He exhibits tenderness and bony tenderness. He exhibits normal range of motion, no swelling, no edema and no deformity.  Neurological: He is alert. He has normal strength. No sensory deficit. Gait normal.  Reflex Scores:      Patellar reflexes are 2+ on the right side and 2+ on the left side. Distal sensation of both feet intact  Skin: Skin is warm and dry. No erythema.  Psychiatric: He has a normal mood and affect.    ED Course  Procedures (including critical care time) Labs Review Labs Reviewed - No data to display  Imaging Review No results  found.   EKG Interpretation None      MDM   Final diagnoses:  None   Patient with back pain.  No neurological deficits and normal neuro exam.  Patient can walk but states is painful.  No loss of bowel or bladder control.  No concern for cauda equina.  No fever, night sweats, weight loss, h/o cancer, IVDU.  RICE protocol and pain medicine indicated and discussed with patient. Patient stable for discharge.  Return precautions given.     Santiago Glad, PA-C 12/06/13 1017

## 2014-07-15 ENCOUNTER — Ambulatory Visit (HOSPITAL_COMMUNITY)
Admission: RE | Admit: 2014-07-15 | Discharge: 2014-07-15 | Disposition: A | Discharge status: Home / Self Care | Payer: Self-pay | Source: Ambulatory Visit | Attending: Physician Assistant | Admitting: Physician Assistant

## 2014-07-15 ENCOUNTER — Other Ambulatory Visit (HOSPITAL_COMMUNITY): Payer: Self-pay | Admitting: Physician Assistant

## 2014-07-15 DIAGNOSIS — G44319 Acute post-traumatic headache, not intractable: Secondary | ICD-10-CM

## 2014-11-06 ENCOUNTER — Encounter (HOSPITAL_COMMUNITY): Payer: Self-pay | Admitting: Emergency Medicine

## 2014-11-06 ENCOUNTER — Emergency Department (HOSPITAL_COMMUNITY)
Admission: EM | Admit: 2014-11-06 | Discharge: 2014-11-06 | Disposition: A | Discharge status: Home / Self Care | Payer: Self-pay | Attending: Emergency Medicine | Admitting: Emergency Medicine

## 2014-11-06 DIAGNOSIS — Z720 Tobacco use: Secondary | ICD-10-CM | POA: Insufficient documentation

## 2014-11-06 DIAGNOSIS — M5416 Radiculopathy, lumbar region: Secondary | ICD-10-CM | POA: Insufficient documentation

## 2014-11-06 DIAGNOSIS — Z791 Long term (current) use of non-steroidal anti-inflammatories (NSAID): Secondary | ICD-10-CM | POA: Insufficient documentation

## 2014-11-06 MED ORDER — IBUPROFEN 800 MG PO TABS: 800.0000 mg | ORAL_TABLET | Freq: Three times a day (TID) | ORAL | Status: DC

## 2014-11-06 MED ORDER — METHOCARBAMOL 500 MG PO TABS: 1000.0000 mg | ORAL_TABLET | Freq: Four times a day (QID) | ORAL | Status: AC

## 2014-11-06 MED ORDER — HYDROCODONE-ACETAMINOPHEN 5-325 MG PO TABS: 1.0000 | ORAL_TABLET | ORAL | Status: DC | PRN

## 2014-11-06 NOTE — ED Notes (Signed)
C/o pain to lower back pain.  Rates pain 10/10.  Shooting to left leg.

## 2014-11-06 NOTE — Discharge Instructions (Signed)
Lumbosacral Radiculopathy °Lumbosacral radiculopathy is a pinched nerve or nerves in the low back (lumbosacral area). When this happens you may have weakness in your legs and may not be able to stand on your toes. You may have pain going down into your legs. There may be difficulties with walking normally. There are many causes of this problem. Sometimes this may happen from an injury, or simply from arthritis or boney problems. It may also be caused by other illnesses such as diabetes. If there is no improvement after treatment, further studies may be done to find the exact cause. °DIAGNOSIS  °X-rays may be needed if the problems become long standing. Electromyograms may be done. This study is one in which the working of nerves and muscles is studied. °HOME CARE INSTRUCTIONS  °· Applications of ice packs may be helpful. Ice can be used in a plastic bag with a towel around it to prevent frostbite to skin. This may be used every 2 hours for 20 to 30 minutes, or as needed, while awake, or as directed by your caregiver. °· Only take over-the-counter or prescription medicines for pain, discomfort, or fever as directed by your caregiver. °· If physical therapy was prescribed, follow your caregiver's directions. °SEEK IMMEDIATE MEDICAL CARE IF:  °· You have pain not controlled with medications. °· You seem to be getting worse rather than better. °· You develop increasing weakness in your legs. °· You develop loss of bowel or bladder control. °· You have difficulty with walking or balance, or develop clumsiness in the use of your legs. °· You have a fever. °MAKE SURE YOU:  °· Understand these instructions. °· Will watch your condition. °· Will get help right away if you are not doing well or get worse. °Document Released: 03/28/2005 Document Revised: 06/20/2011 Document Reviewed: 11/16/2007 °[employer]® Patient Information ©2015 [employer]. This information is not intended to replace advice given to you by your health  care provider. Make sure you discuss any questions you have with your health care provider. ° ° °Do not drive within 4 hours of taking hydrocodone as this will make you drowsy.  Avoid lifting,  Bending,  Twisting or any other activity that worsens your pain over the next week.  Apply an  icepack  to your lower back for 10-15 minutes every 2 hours for the next 2 days.  You should get rechecked if your symptoms are not better over the next 5 days,  Or you develop increased pain,  Weakness in your leg(s) or loss of bladder or bowel function - these are symptoms of a worse injury. ° ° ° °

## 2014-11-07 NOTE — ED Provider Notes (Signed)
CSN: [id number]     Arrival date & time 11/06/14  1155 History   First MD Initiated Contact with Patient 11/06/14 1212     Chief Complaint  Patient presents with  . Leg Pain     (Consider location/radiation/quality/duration/timing/severity/associated sxs/prior Treatment) Patient is a 34 y.o. male presenting with leg pain. The history is provided by the patient.  Leg Pain Associated symptoms: back pain   Associated symptoms: no fever    Kyle Beasley is a 34 y.o. male presenting with acute on chronic low back pain which has which has flared up over the past several days.   Patient denies any new injury specifically.  He has known degenerative disk disease with disk herniation pressing his right S1 nerve root (per MRI obtained in 2014) which patient endorses chronic right foot numbness which is not new or different today.  However, he now has left sided buttock pain with radiation down his left posterior thigh to his ankle which is new this week. There has been no weakness or numbness in the lower extremities and no urinary or bowel retention or incontinence.  Patient does not have a history of cancer or IVDU.  He was evaluated by Dr Phoebe Perch in 2014 who at that time recommended surgery, but patient has been avoiding this since there was not a guarantee surgery would resolve his pain issues. The patient has tried tylenol  without significant relief of symptoms.    Past Medical History  Diagnosis Date  . Chronic sciatica    History reviewed. No pertinent past surgical history. History reviewed. No pertinent family history. History  Substance Use Topics  . Smoking status: Current Every Day Smoker    Types: Cigarettes  . Smokeless tobacco: Not on file  . Alcohol Use: Yes     Comment: occ    Review of Systems  Constitutional: Negative for fever.  Respiratory: Negative for shortness of breath.   Cardiovascular: Negative for chest pain and leg swelling.  Gastrointestinal: Negative for  abdominal pain, constipation and abdominal distention.  Genitourinary: Negative for dysuria, urgency, frequency, flank pain and difficulty urinating.  Musculoskeletal: Positive for back pain. Negative for joint swelling and gait problem.  Skin: Negative for rash.  Neurological: Negative for weakness and numbness.      Allergies  Onion and Peanut-containing drug products  Home Medications   Prior to Admission medications   Medication Sig Start Date End Date Taking? Authorizing Provider  HYDROcodone-acetaminophen (NORCO/VICODIN) 5-325 MG per tablet Take 1 tablet by mouth every 4 (four) hours as needed. 11/06/14   Burgess Amor, PA-C  ibuprofen (ADVIL,MOTRIN) 800 MG tablet Take 1 tablet (800 mg total) by mouth 3 (three) times daily. 11/06/14   Burgess Amor, PA-C  methocarbamol (ROBAXIN) 500 MG tablet Take 2 tablets (1,000 mg total) by mouth 4 (four) times daily. 11/06/14 11/16/14  Burgess Amor, PA-C  naproxen (NAPROSYN) 500 MG tablet Take 1 tablet (500 mg total) by mouth 2 (two) times daily. 12/06/13   Heather Laisure, PA-C  naproxen sodium (ANAPROX) 220 MG tablet Take 440 mg by mouth 2 (two) times daily as needed (for pain).    Historical Provider, MD  omeprazole (PRILOSEC) 20 MG capsule Take 20 mg by mouth daily as needed (for heartburn).    Historical Provider, MD   BP 135/83 mmHg  Pulse 76  Temp(Src) 98.4 F (36.9 C) (Oral)  Resp 18  Ht 5\' 6"  (1.676 m)  Wt 160 lb (72.576 kg)  BMI 25.84 kg/m2  SpO2  98% Physical Exam  Constitutional: He appears well-developed and well-nourished.  HENT:  Head: Normocephalic.  Eyes: Conjunctivae are normal.  Neck: Normal range of motion. Neck supple.  Cardiovascular: Normal rate and intact distal pulses.   Pedal pulses normal.  Pulmonary/Chest: Effort normal.  Abdominal: Soft. Bowel sounds are normal. He exhibits no distension and no mass.  Musculoskeletal: Normal range of motion. He exhibits no edema.       Lumbar back: He exhibits tenderness. He exhibits  no swelling, no edema and no spasm.  Neurological: He is alert. He has normal strength. He displays no atrophy and no tremor. No sensory deficit.  Reflex Scores:      Patellar reflexes are 2+ on the right side and 2+ on the left side.      Achilles reflexes are 2+ on the right side and 2+ on the left side. No strength deficit noted in hip and knee flexor and extensor muscle groups.  Ankle flexion and extension intact. Ambulatory favoring the left leg.  Skin: Skin is warm and dry.  Psychiatric: He has a normal mood and affect.  Nursing note and vitals reviewed.   ED Course  Procedures (including critical care time) Labs Review Labs Reviewed - No data to display  Imaging Review No results found.   EKG Interpretation None      MDM   Final diagnoses:  Lumbar radiculopathy, acute    Discussed prior MRI from 2014 and his probable need to be re-evaluated by neurosurgery.  Pt was desirous of a neurosurgical second opinion.  He was given Dr. Temple Pacini info as a resource.  His pcp is Dr Sherryll Burger in [place] although has been unable to see recently due to insurance issues.  Pt was prescribed hydrocodone, ibuprofen, robaxin, advised heat tx, activity as tolerated, avoiding lifting, bending, twisting.  Prn f/u anticipated.    No neuro deficit on exam or by history to suggest emergent or surgical presentation.  Also discussed worsened sx that should prompt immediate re-evaluation including distal weakness, bowel/bladder retention/incontinence.          Burgess Amor, PA-C 11/07/14 0981  Rolland Porter, MD 11/08/14 1003

## 2014-11-10 ENCOUNTER — Emergency Department (HOSPITAL_COMMUNITY)
Admission: EM | Admit: 2014-11-10 | Discharge: 2014-11-10 | Disposition: A | Discharge status: Home / Self Care | Payer: Self-pay | Attending: Emergency Medicine | Admitting: Emergency Medicine

## 2014-11-10 ENCOUNTER — Encounter (HOSPITAL_COMMUNITY): Payer: Self-pay | Admitting: *Deleted

## 2014-11-10 DIAGNOSIS — M545 Low back pain, unspecified: Secondary | ICD-10-CM

## 2014-11-10 DIAGNOSIS — Z79899 Other long term (current) drug therapy: Secondary | ICD-10-CM | POA: Insufficient documentation

## 2014-11-10 DIAGNOSIS — Z791 Long term (current) use of non-steroidal anti-inflammatories (NSAID): Secondary | ICD-10-CM | POA: Insufficient documentation

## 2014-11-10 DIAGNOSIS — G8929 Other chronic pain: Secondary | ICD-10-CM | POA: Insufficient documentation

## 2014-11-10 DIAGNOSIS — Z720 Tobacco use: Secondary | ICD-10-CM | POA: Insufficient documentation

## 2014-11-10 DIAGNOSIS — M5432 Sciatica, left side: Secondary | ICD-10-CM | POA: Insufficient documentation

## 2014-11-10 MED ORDER — HYDROMORPHONE HCL 1 MG/ML IJ SOLN
1.0000 mg | Freq: Once | INTRAMUSCULAR | Status: AC
  Administered 2014-11-10: 1 mg via INTRAMUSCULAR
  Filled 2014-11-10: qty 1

## 2014-11-10 MED ORDER — METHYLPREDNISOLONE 4 MG PO TBPK: ORAL_TABLET | ORAL | Status: DC

## 2014-11-10 MED ORDER — DIAZEPAM 5 MG/ML IJ SOLN
10.0000 mg | Freq: Once | INTRAMUSCULAR | Status: AC
  Administered 2014-11-10: 10 mg via INTRAMUSCULAR
  Filled 2014-11-10: qty 2

## 2014-11-10 MED ORDER — CYCLOBENZAPRINE HCL 10 MG PO TABS: 10.0000 mg | ORAL_TABLET | Freq: Three times a day (TID) | ORAL | Status: DC | PRN

## 2014-11-10 MED ORDER — DEXAMETHASONE SODIUM PHOSPHATE 4 MG/ML IJ SOLN
10.0000 mg | Freq: Once | INTRAMUSCULAR | Status: AC
  Administered 2014-11-10: 10 mg via INTRAMUSCULAR
  Filled 2014-11-10: qty 3

## 2014-11-10 MED ORDER — OXYCODONE-ACETAMINOPHEN 5-325 MG PO TABS: 1.0000 | ORAL_TABLET | Freq: Four times a day (QID) | ORAL | Status: DC | PRN
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
## 2014-11-10 NOTE — ED Notes (Signed)
Pt c/o lower back pain that radiates down leg, denies any injury, states that he was seen in on 047/28/2016 for same and is not any better,

## 2014-11-10 NOTE — Discharge Instructions (Signed)
Try ice and heat to your back. Take the medications as prescribed. Follow up with Dr Channing Mutters as discussed with your last visit. You may benefit from phsyical therapy or injections.      Sciatica Sciatica is pain, weakness, numbness, or tingling along the path of the sciatic nerve. The nerve starts in the lower back and runs down the back of each leg. The nerve controls the muscles in the lower leg and in the back of the knee, while also providing sensation to the back of the thigh, lower leg, and the sole of your foot. Sciatica is a symptom of another medical condition. For instance, nerve damage or certain conditions, such as a herniated disk or bone spur on the spine, pinch or put pressure on the sciatic nerve. This causes the pain, weakness, or other sensations normally associated with sciatica. Generally, sciatica only affects one side of the body. CAUSES   Herniated or slipped disc.  Degenerative disk disease.  A pain disorder involving the narrow muscle in the buttocks (piriformis syndrome).  Pelvic injury or fracture.  Pregnancy.  Tumor (rare). SYMPTOMS  Symptoms can vary from mild to very severe. The symptoms usually travel from the low back to the buttocks and down the back of the leg. Symptoms can include:  Mild tingling or dull aches in the lower back, leg, or hip.  Numbness in the back of the calf or sole of the foot.  Burning sensations in the lower back, leg, or hip.  Sharp pains in the lower back, leg, or hip.  Leg weakness.  Severe back pain inhibiting movement. These symptoms may get worse with coughing, sneezing, laughing, or prolonged sitting or standing. Also, being overweight may worsen symptoms. DIAGNOSIS  Your caregiver will perform a physical exam to look for common symptoms of sciatica. He or she may ask you to do certain movements or activities that would trigger sciatic nerve pain. Other tests may be performed to find the cause of the sciatica. These may  include:  Blood tests.  X-rays.  Imaging tests, such as an MRI or CT scan. TREATMENT  Treatment is directed at the cause of the sciatic pain. Sometimes, treatment is not necessary and the pain and discomfort goes away on its own. If treatment is needed, your caregiver may suggest:  Over-the-counter medicines to relieve pain.  Prescription medicines, such as anti-inflammatory medicine, muscle relaxants, or narcotics.  Applying heat or ice to the painful area.  Steroid injections to lessen pain, irritation, and inflammation around the nerve.  Reducing activity during periods of pain.  Exercising and stretching to strengthen your abdomen and improve flexibility of your spine. Your caregiver may suggest losing weight if the extra weight makes the back pain worse.  Physical therapy.  Surgery to eliminate what is pressing or pinching the nerve, such as a bone spur or part of a herniated disk. HOME CARE INSTRUCTIONS   Only take over-the-counter or prescription medicines for pain or discomfort as directed by your caregiver.  Apply ice to the affected area for 20 minutes, 3-4 times a day for the first 48-72 hours. Then try heat in the same way.  Exercise, stretch, or perform your usual activities if these do not aggravate your pain.  Attend physical therapy sessions as directed by your caregiver.  Keep all follow-up appointments as directed by your caregiver.  Do not wear high heels or shoes that do not provide proper support.  Check your mattress to see if it is too soft.  A firm mattress may lessen your pain and discomfort. SEEK IMMEDIATE MEDICAL CARE IF:   You lose control of your bowel or bladder (incontinence).  You have increasing weakness in the lower back, pelvis, buttocks, or legs.  You have redness or swelling of your back.  You have a burning sensation when you urinate.  You have pain that gets worse when you lie down or awakens you at night.  Your pain is worse  than you have experienced in the past.  Your pain is lasting longer than 4 weeks.  You are suddenly losing weight without reason. MAKE SURE YOU:  Understand these instructions.  Will watch your condition.  Will get help right away if you are not doing well or get worse. Document Released: 03/22/2001 Document Revised: 09/27/2011 Document Reviewed: 08/07/2011 [facility] Patient Information 2015 [place], [place]. This information is not intended to replace advice given to you by your health care provider. Make sure you discuss any questions you have with your health care provider.

## 2015-03-12 ENCOUNTER — Ambulatory Visit: Payer: Self-pay | Admitting: Physician Assistant

## 2015-04-03 ENCOUNTER — Emergency Department (HOSPITAL_COMMUNITY)
Admission: EM | Admit: 2015-04-03 | Discharge: 2015-04-03 | Disposition: A | Discharge status: Home / Self Care | Payer: Self-pay | Attending: Emergency Medicine | Admitting: Emergency Medicine

## 2015-04-03 ENCOUNTER — Encounter (HOSPITAL_COMMUNITY): Payer: Self-pay | Admitting: Emergency Medicine

## 2015-04-03 ENCOUNTER — Emergency Department (HOSPITAL_COMMUNITY): Payer: Self-pay

## 2015-04-03 DIAGNOSIS — R197 Diarrhea, unspecified: Secondary | ICD-10-CM | POA: Insufficient documentation

## 2015-04-03 DIAGNOSIS — R112 Nausea with vomiting, unspecified: Secondary | ICD-10-CM | POA: Insufficient documentation

## 2015-04-03 DIAGNOSIS — Z79899 Other long term (current) drug therapy: Secondary | ICD-10-CM | POA: Insufficient documentation

## 2015-04-03 DIAGNOSIS — Z8739 Personal history of other diseases of the musculoskeletal system and connective tissue: Secondary | ICD-10-CM | POA: Insufficient documentation

## 2015-04-03 DIAGNOSIS — F17210 Nicotine dependence, cigarettes, uncomplicated: Secondary | ICD-10-CM | POA: Insufficient documentation

## 2015-04-03 DIAGNOSIS — Z791 Long term (current) use of non-steroidal anti-inflammatories (NSAID): Secondary | ICD-10-CM | POA: Insufficient documentation

## 2015-04-03 DIAGNOSIS — R1084 Generalized abdominal pain: Secondary | ICD-10-CM | POA: Insufficient documentation

## 2015-04-03 LAB — COMPREHENSIVE METABOLIC PANEL
ALBUMIN: 3.9 g/dL (ref 3.5–5.0)
ALK PHOS: 68 U/L (ref 38–126)
ALT: 29 U/L (ref 17–63)
ANION GAP: 12 (ref 5–15)
AST: 34 U/L (ref 15–41)
BUN: 8 mg/dL (ref 6–20)
CALCIUM: 9.2 mg/dL (ref 8.9–10.3)
CHLORIDE: 101 mmol/L (ref 101–111)
CO2: 25 mmol/L (ref 22–32)
Creatinine, Ser: 0.95 mg/dL (ref 0.61–1.24)
GFR calc Af Amer: >60 mL/min (ref >60)
GFR calc non Af Amer: >60 mL/min (ref >60)
GLUCOSE: 90 mg/dL (ref 65–99)
Potassium: 3.6 mmol/L (ref 3.5–5.1)
SODIUM: 138 mmol/L (ref 135–145)
Total Bilirubin: 1.1 mg/dL (ref 0.3–1.2)
Total Protein: 7.1 g/dL (ref 6.5–8.1)

## 2015-04-03 LAB — CBC
HCT: 46.6 % (ref 39.0–52.0)
Hemoglobin: 16.3 g/dL (ref 13.0–17.0)
MCH: 34 pg (ref 26.0–34.0)
MCHC: 35 g/dL (ref 30.0–36.0)
MCV: 97.1 fL (ref 78.0–100.0)
Platelets: 341 10*3/uL (ref 150–400)
RBC: 4.8 MIL/uL (ref 4.22–5.81)
RDW: 12.4 % (ref 11.5–15.5)
WBC: 6.8 10*3/uL (ref 4.0–10.5)

## 2015-04-03 LAB — LIPASE, BLOOD: Lipase: 23 U/L (ref 11–51)

## 2015-04-03 MED ORDER — DICYCLOMINE HCL 10 MG/ML IM SOLN
20.0000 mg | Freq: Once | INTRAMUSCULAR | Status: AC
  Administered 2015-04-03: 20 mg via INTRAMUSCULAR
  Filled 2015-04-03: qty 2

## 2015-04-03 MED ORDER — ONDANSETRON 8 MG PO TBDP
8.0000 mg | ORAL_TABLET | Freq: Once | ORAL | Status: AC
  Administered 2015-04-03: 8 mg via ORAL
  Filled 2015-04-03: qty 1

## 2015-04-03 MED ORDER — DICYCLOMINE HCL 20 MG PO TABS: 20.0000 mg | ORAL_TABLET | Freq: Four times a day (QID) | ORAL | Status: DC | PRN

## 2015-04-03 MED ORDER — ONDANSETRON HCL 4 MG PO TABS: 4.0000 mg | ORAL_TABLET | Freq: Three times a day (TID) | ORAL | Status: DC | PRN

## 2015-04-03 NOTE — ED Provider Notes (Signed)
CSN: [id number]     Arrival date & time 04/03/15  1559 History   First MD Initiated Contact with Patient 04/03/15 1852     Chief Complaint  Patient presents with  . Diarrhea  . Emesis      HPI  Pt was seen at 1855. Per pt, c/o gradual onset and persistence of multiple intermittent episodes of N/V/D that began 3 days ago.   Describes the stools as "watery." Has been associated with generalized abd "cramping." Denies abd pain, no CP/SOB, no back pain, no fevers, no black or blood in stools or emesis.    Past Medical History  Diagnosis Date  . Chronic sciatica    History reviewed. No pertinent past surgical history.  Social History  Substance Use Topics  . Smoking status: Current Every Day Smoker -- 0.50 packs/day    Types: Cigarettes  . Smokeless tobacco: None  . Alcohol Use: Yes     Comment: occ    Review of Systems ROS: Statement: All systems negative except as marked or noted in the HPI; Constitutional: Negative for fever and chills. ; ; Eyes: Negative for eye pain, redness and discharge. ; ; ENMT: Negative for ear pain, hoarseness, nasal congestion, sinus pressure and sore throat. ; ; Cardiovascular: Negative for chest pain, palpitations, diaphoresis, dyspnea and peripheral edema. ; ; Respiratory: Negative for cough, wheezing and stridor. ; ; Gastrointestinal: +N/V/D, abd "cramping." Negative for blood in stool, hematemesis, jaundice and rectal bleeding. . ; ; Genitourinary: Negative for dysuria, flank pain and hematuria. ; ; Musculoskeletal: Negative for back pain and neck pain. Negative for swelling and trauma.; ; Skin: Negative for pruritus, rash, abrasions, blisters, bruising and skin lesion.; ; Neuro: Negative for headache, lightheadedness and neck stiffness. Negative for weakness, altered level of consciousness , altered mental status, extremity weakness, paresthesias, involuntary movement, seizure and syncope.      Allergies  Onion and Peanut-containing drug  products  Home Medications   Prior to Admission medications   Medication Sig Start Date End Date Taking? Authorizing Provider  cyclobenzaprine (FLEXERIL) 10 MG tablet Take 1 tablet (10 mg total) by mouth 3 (three) times daily as needed for muscle spasms. 11/10/14   Devoria Albe, MD  HYDROcodone-acetaminophen (NORCO/VICODIN) 5-325 MG per tablet Take 1 tablet by mouth every 4 (four) hours as needed. 11/06/14   Burgess Amor, PA-C  ibuprofen (ADVIL,MOTRIN) 800 MG tablet Take 1 tablet (800 mg total) by mouth 3 (three) times daily. 11/06/14   Burgess Amor, PA-C  methylPREDNISolone (MEDROL DOSEPAK) 4 MG TBPK tablet Take 6 the first day, the one less pill daily until gone 11/10/14   Devoria Albe, MD  naproxen (NAPROSYN) 500 MG tablet Take 1 tablet (500 mg total) by mouth 2 (two) times daily. 12/06/13   Heather Laisure, PA-C  naproxen sodium (ANAPROX) 220 MG tablet Take 440 mg by mouth 2 (two) times daily as needed (for pain).    Historical Provider, MD  omeprazole (PRILOSEC) 20 MG capsule Take 20 mg by mouth daily as needed (for heartburn).    Historical Provider, MD  oxyCODONE-acetaminophen (PERCOCET/ROXICET) 5-325 MG per tablet Take 1 tablet by mouth every 6 (six) hours as needed for severe pain. 11/10/14   Devoria Albe, MD   BP 128/79 mmHg  Pulse 71  Temp(Src) 98.8 F (37.1 C) (Oral)  Resp 18  SpO2 100% Physical Exam  1900: Physical examination:  Nursing notes reviewed; Vital signs and O2 SAT reviewed;  Constitutional: Well developed, Well nourished, Well hydrated, In no  acute distress; Head:  Normocephalic, atraumatic; Eyes: EOMI, PERRL, No scleral icterus; ENMT: Mouth and pharynx normal, Mucous membranes moist; Neck: Supple, Full range of motion, No lymphadenopathy; Cardiovascular: Regular rate and rhythm, No murmur, rub, or gallop; Respiratory: Breath sounds clear & equal bilaterally, No rales, rhonchi, wheezes.  Speaking full sentences with ease, Normal respiratory effort/excursion; Chest: Nontender, Movement  normal; Abdomen: Soft, Nontender, Nondistended, Normal bowel sounds; Genitourinary: No CVA tenderness; Extremities: Pulses normal, No tenderness, No edema, No calf edema or asymmetry.; Neuro: AA&Ox3, Major CN grossly intact.  Speech clear. No gross focal motor or sensory deficits in extremities. Climbs on and off stretcher easily by himself. Gait steady.; Skin: Color normal, Warm, Dry.   ED Course  Procedures (including critical care time) Labs Review  Imaging Review  I have personally reviewed and evaluated these images and lab results as part of my medical decision-making.   EKG Interpretation None      MDM  MDM Reviewed: previous chart, nursing note and vitals Reviewed previous: labs Interpretation: labs and x-ray      Results for orders placed or performed during the hospital encounter of 04/03/15  Lipase, blood  Result Value Ref Range   Lipase 23 11 - 51 U/L  Comprehensive metabolic panel  Result Value Ref Range   Sodium 138 135 - 145 mmol/L   Potassium 3.6 3.5 - 5.1 mmol/L   Chloride 101 101 - 111 mmol/L   CO2 25 22 - 32 mmol/L   Glucose, Bld 90 65 - 99 mg/dL   BUN 8 6 - 20 mg/dL   Creatinine, Ser 1.190.95 0.61 - 1.24 mg/dL   Calcium 9.2 8.9 - 14.710.3 mg/dL   Total Protein 7.1 6.5 - 8.1 g/dL   Albumin 3.9 3.5 - 5.0 g/dL   AST 34 15 - 41 U/L   ALT 29 17 - 63 U/L   Alkaline Phosphatase 68 38 - 126 U/L   Total Bilirubin 1.1 0.3 - 1.2 mg/dL   GFR calc non Af Amer >60 >60 mL/min   GFR calc Af Amer >60 >60 mL/min   Anion gap 12 5 - 15  CBC  Result Value Ref Range   WBC 6.8 4.0 - 10.5 K/uL   RBC 4.80 4.22 - 5.81 MIL/uL   Hemoglobin 16.3 13.0 - 17.0 g/dL   HCT 82.946.6 56.239.0 - 13.052.0 %   MCV 97.1 78.0 - 100.0 fL   MCH 34.0 26.0 - 34.0 pg   MCHC 35.0 30.0 - 36.0 g/dL   RDW 86.512.4 78.411.5 - 69.615.5 %   Platelets 341 150 - 400 K/uL   Dg Abd Acute W/chest 04/03/2015  CLINICAL DATA:  Nausea, diarrhea, fever for week. EXAM: DG ABDOMEN ACUTE W/ 1V CHEST COMPARISON:  None. FINDINGS:  Normal heart size and pulmonary vascularity. No focal airspace disease or consolidation in the lungs. No blunting of costophrenic angles. No pneumothorax. Mediastinal contours appear intact. Scattered gas and stool in the colon. No small or large bowel distention. No free intra-abdominal air. No abnormal air-fluid levels. No radiopaque stones. Visualized bones appear intact. IMPRESSION: Negative abdominal radiographs.  No acute cardiopulmonary disease. Electronically Signed   By: Ted Mcalpineobrinka  Dimitrova M.D.   On: 04/03/2015 19:46    2040:  Pt has tol PO well while in the ED without N/V.  No stooling while in the ED.  Abd remains benign, VSS. Feels better and wants to go home now. Tx symptomatically at this time. Dx and testing d/w pt.  Questions answered.  Verb  understanding, agreeable to d/c home with outpt f/u.    Samuel Jester, DO 04/05/15 1234

## 2015-04-03 NOTE — Discharge Instructions (Signed)
°Emergency Department Resource Guide °1) Find a Doctor and Pay Out of Pocket °Although you won't have to find out who is covered by your insurance plan, it is a good idea to ask around and get recommendations. You will then need to call the office and see if the doctor you have chosen will accept you as a new patient and what types of options they offer for patients who are self-pay. Some doctors offer discounts or will set up payment plans for their patients who do not have insurance, but you will need to ask so you aren't surprised when you get to your appointment. ° °2) Contact Your Local Health Department °Not all health departments have doctors that can see patients for sick visits, but many do, so it is worth a call to see if yours does. If you don't know where your local health department is, you can check in your phone book. The CDC also has a tool to help you locate your state's health department, and many state websites also have listings of all of their local health departments. ° °3) Find a Walk-in Clinic °If your illness is not likely to be very severe or complicated, you may want to try a walk in clinic. These are popping up all over the country in pharmacies, drugstores, and shopping centers. They're usually staffed by nurse practitioners or physician assistants that have been trained to treat common illnesses and complaints. They're usually fairly quick and inexpensive. However, if you have serious medical issues or chronic medical problems, these are probably not your best option. ° °No Primary Care Doctor: °- Call [facility] at  832-8000 - they can help you locate a primary care doctor that  accepts your insurance, provides certain services, etc. °- Physician Referral Service- 1-800-533-3463 ° °Chronic Pain Problems: °Organization         Address  Phone   Notes  °[facility]  ([phone]) [phone] Patients need to be referred by their primary care doctor.  ° °Medication  Assistance: °Organization         Address  Phone   Notes  °[place] [facility] [address] °[place], [place] [address] (336) 641-8030 --Must be a resident of [place] °-- Must have NO insurance coverage whatsoever (no Medicaid/ Medicare, etc.) °-- The pt. MUST have a primary care doctor that directs their care regularly and follows them in the community °  °[facility]  (866) 331-1348   °[employer]  (888) 892-1162   ° °Agencies that provide inexpensive medical care: °Organization         Address  Phone   Notes  °[facility]  (336) 832-8035   °[facility]    (336) 832-7272   °[facility] [address] °[place], [place] [address] (336) 832-4777   °[facility] [address], °[place] (336) 271-4999   °[facility]    (336) 373-0678   °[facility]    (336) 272-1050   °[facility] ° [address], [place] Phone:  (336) 832-4444, Fax:  (336) 832-4440 Hours of Operation:  9 am - 6 pm, M-F.  Also accepts Medicaid/Medicare and self-pay.  °[facility] ° [address], [place] Phone: (336) [phone], Fax: (336) [phone]. Hours of Operation:  8:30 am - 5:30 pm, M-F.  Also accepts Medicaid and self-pay.  °[facility] [place] [address], [place] Phone: (336) 878-6027   °[facility] [address], [place], [place] (336)723-1848, Ext. 123 Mondays & Thursdays: 7-9 AM.  First 15 patients are seen on a first come, first serve basis. °  ° °Medicaid-accepting [place] Providers: ° °Organization         Address  Phone   Notes  °[facility] [address], [place] (336) 641-2100 Also accepts self-pay patients.  °[facility] [address], [place] ° (336) 856-9996   °[facility] [address], [place]  (336) 288-8857   °[facility] [address] [place] [address], [place] (336) 299-7000   °Veita Bland [address], [place]  ° (336) 373-1557 Only accepts [place] Access Medicaid patients after they have their name applied to their card.  ° °Self-Pay (no insurance) in [place]: ° °Organization         Address  Phone   Notes  °Sickle Cell Patients, [facility] [address], [place] (336) 832-1970   °[facility] [address], [place] (336) 832-4400   °[facility] ° [address], [place] (336) 992-4800   °[facility]/Dr. Osei-Bonsu ° [address] [place] [address], [place] or [address], [place] (336) 841-8500 Phone number for both [place] and [place] locations is the same.  °[facility] [address], [place] (336) 299-0000   °[facility] [address] [place] [address], [place] or [address] (336) 852-7530 °(336) 878-2260   °[facility] [address], [place] (336) 350-1642, phone; (336) 294-5005, fax Sees patients 1st and 3rd Saturday of every month.  Must not qualify for public or private insurance (i.e. Medicaid, Medicare, [place] Health Choice, Veterans' Benefits) • Household income should be no more than 200% of the poverty level •The clinic cannot treat you if you are pregnant or think you are pregnant • Sexually transmitted diseases are not treated at the clinic.  ° ° °Dental Care: °Organization         Address  Phone  Notes  °[place] [facility] [address], [place] (336) 641-6152 Accepts children up to age 21 who are enrolled in Medicaid or [place] Health Choice; pregnant women with a Medicaid card; and children who have applied for Medicaid or [place] Health Choice, but were declined, whose parents can pay a reduced fee at time of service.  °[place]  [facility] [place]  [address], [place] (336) 641-7733 Accepts children up to age 21 who are enrolled in Medicaid or [place] Health Choice; pregnant women with a Medicaid card; and children who have applied for Medicaid or [place] Health Choice, but were declined, whose parents can pay a reduced fee at time of service.  °[facility] ° [address], [place] (336) 641-4533 Patients are seen by appointment only. Walk-ins are not accepted. [facility] will see patients 18 years of age and older. °Monday - Tuesday (8am-5pm) °Most Wednesdays (8:30-5pm) °$30 per visit, cash only  °[facility] ° [address], [place] (336) 641-4533 Patients are seen by appointment only. Walk-ins are not accepted. [facility] will see patients 18 years of age and older. °One   Wednesday Evening (Monthly: Volunteer Based).  $30 per visit, cash only  °[facility]  (919) 537-3737 for adults; Children under age 4, call Graduate Pediatric Dentistry at ([phone]) [phone]. Children aged 4-14, please call ([phone]) [phone] to request a pediatric application. ° Dental services are provided in all areas of dental care including fillings, crowns and bridges, complete and partial dentures, implants, gum treatment, root canals, and extractions. Preventive care is also provided. Treatment is provided to both adults and children. °Patients are selected via a lottery and there is often a waiting list. °  °[facility] [address], °[place] ° (336) 763-8833 [email or web address] °  °[facility] [address], [place], [place] (336)723-1848, Ext. 123 Second and Fourth Thursday of each month, opens at 6:30 AM; Clinic ends at 9 AM.  Patients are seen on a first-come first-served basis, and a limited number are seen during each clinic.  ° °[facility] ° [address], [place], [place] (336) 723-7904    Eligibility Requirements °You must have lived in [place], [place], or [place] counties for at least the last three months. °  You cannot be eligible for state or federal sponsored healthcare insurance, including Veterans Administration, Medicaid, or Medicare. °  You generally cannot be eligible for healthcare insurance through your employer.  °  How to apply: °Eligibility screenings are held every Tuesday and Wednesday afternoon from 1:00 pm until 4:00 pm. You do not need an appointment for the interview!  °[facility] [address], [place], [place] 336-631-2330   °[facility]  336-342-8273   °[facility]  336-703-3100   °[facility]  336-570-6415   ° °Behavioral Health Resources in the Community: °Intensive Outpatient Programs °Organization         Address  Phone  Notes  °[facility] [address], [place], [place] 336-878-6098   °[facility] [address], [place], [place] 336-832-9800   °[facility] [address], [place], [place] ° 336-882-2125   °[facility] [address],  °[place], [place] 1-800-853-5163 or 336-641-4981   °Substance Abuse Resources °Organization         Address  Phone  Notes  °[facility]  336-882-2125   °[facility]  336-784-9470   °[facility]  336-285-9073   °[facility]  336-845-3988   °Residential & Outpatient Substance Abuse Program  1-800-659-3381   °Psychological Services °Organization         Address  Phone  Notes  °[facility]  336- 832-9600   °[facility]  336- 378-7881   °[facility] [address], [place] 1-800-853-5163 or 336-641-4981   ° °Mobile Crisis Teams °Organization         Address  Phone  Notes  °[facility], Mobile Crisis Care Unit  1-877-626-1772   °Assertive °[facility] ° [address]  [place], [place] 336-834-9664   °Sharon DeEsch [address] °[place] 336-554-5454   ° °Self-Help/Support Groups °Organization         Address  Phone             Notes  °[facility] of  - variety of support groups  336- 373-1402 Call for more information  °Narcotics Anonymous (NA), [facility] [address], °[place]  2 meetings at this location  ° °  Residential Treatment Programs Organization         Address  Phone  Notes  [facility] [address],    [place]  [phone]   [facility]  [address], [place] [phone], [place], [place] [phone]   [facility] [address], [place] [phone] Admissions: 8am-3pm M-F  [facility] [address]    [place], [place] [phone]   [facility] [address], [place], [place] [phone]   [facility] [address]  [place], [place] [phone]   [facility] Intensive Outpatient [address], [place], [place] [phone]   [facility] ([facility] [address]  [place], [place] [phone]-(404)[phone] or (563)[phone](332)540-0172   [facility] ([facility] [address] [place], [place] [phone] Accepts Medicaid  [facility] [address]  [place] [phone] Substance Abuse/Addiction Treatment   [facility] Organization         Address  Phone  Notes  [facility]  8643270084(888) 412-385-8568   Angie FavaJulie Brannon, PhD [address] [place], [place]   ([phone](336) (334)660-5433 or [phone](336) [phone]   [facility]    [address] [place], [place] [phone](336) [phone]   [facility] [address], [place], [place] [phone](336) [phone] Insurance/Medicaid/sponsorship through [facility] [address]                                    [place], [place] [phone](336) [phone] Therapy/tele-psych/case    [facility] [address]   [place], [place] [phone](336) [phone]    Dr. Lolly MustacheArfeen  [phone](336) [phone]   [facility]  [employer] [facility] 1) [address], [place] 2) [address], [place] 3)  [address], [place] [phone](336) [phone]) [phone](336) [phone]  [phone](336) [phone]   [facility]: [place] Child Abuse Hotline (484)047-6458(336) 743-495-6244 or 541-541-2497(336) [phone] (After Hours)      Take the prescriptions as directed.  Increase your fluid intake (ie:  Gatoraide) for the next few days.  Eat a bland diet and advance to your regular diet slowly as you can tolerate it.   Avoid full strength juices, as well as milk and milk products until your diarrhea has resolved.   Call your regular medical doctor Monday to schedule a follow up appointment this week.  Return to the Emergency Department immediately if not improving (or even worsening) despite taking the medicines as prescribed, any black or bloody stool or vomit, if you develop a fever over "101," or for any other concerns.

## 2015-04-03 NOTE — ED Notes (Signed)
Pt reports generalized abdominal pain, nausea, and diarrhea since Monday. Pt also reports diaphoresis and chills.

## 2015-07-15 ENCOUNTER — Emergency Department (HOSPITAL_COMMUNITY)
Admission: EM | Admit: 2015-07-15 | Discharge: 2015-07-15 | Disposition: A | Discharge status: Home / Self Care | Payer: Self-pay | Attending: Emergency Medicine | Admitting: Emergency Medicine

## 2015-07-15 ENCOUNTER — Encounter (HOSPITAL_COMMUNITY): Payer: Self-pay | Admitting: Emergency Medicine

## 2015-07-15 DIAGNOSIS — K0889 Other specified disorders of teeth and supporting structures: Secondary | ICD-10-CM | POA: Insufficient documentation

## 2015-07-15 DIAGNOSIS — Z79899 Other long term (current) drug therapy: Secondary | ICD-10-CM | POA: Insufficient documentation

## 2015-07-15 DIAGNOSIS — F17210 Nicotine dependence, cigarettes, uncomplicated: Secondary | ICD-10-CM | POA: Insufficient documentation

## 2015-07-15 MED ORDER — AMOXICILLIN 500 MG PO CAPS: 500.0000 mg | ORAL_CAPSULE | Freq: Three times a day (TID) | ORAL | Status: DC

## 2015-07-15 MED ORDER — IBUPROFEN 800 MG PO TABS: 800.0000 mg | ORAL_TABLET | Freq: Three times a day (TID) | ORAL | Status: DC

## 2015-07-15 MED ORDER — HYDROCODONE-ACETAMINOPHEN 5-325 MG PO TABS: 2.0000 | ORAL_TABLET | ORAL | Status: DC | PRN

## 2015-07-15 MED ORDER — HYDROCODONE-ACETAMINOPHEN 5-325 MG PO TABS
2.0000 | ORAL_TABLET | Freq: Once | ORAL | Status: AC
Start: 2015-07-15 — End: 2015-07-15
  Administered 2015-07-15: 2 via ORAL
  Filled 2015-07-15: qty 2

## 2015-07-15 NOTE — ED Notes (Signed)
Right lower tooth missing, swollen painful gums

## 2015-07-15 NOTE — ED Provider Notes (Signed)
CSN: [id number]     Arrival date & time 07/15/15  1106 History   First MD Initiated Contact with Patient 07/15/15 1150     Chief Complaint  Patient presents with  . Dental Pain     (Consider location/radiation/quality/duration/timing/severity/associated sxs/prior Treatment) Patient is a 35 y.o. male presenting with tooth pain. The history is provided by the patient. No language interpreter was used.  Dental Pain Location:  Lower Lower teeth location:  30/RL 1st molar Quality:  Aching Severity:  Moderate Onset quality:  Gradual Timing:  Constant Progression:  Worsening Chronicity:  New Context: abscess   Context: normal dentition   Relieved by:  Nothing Worsened by:  Nothing tried Ineffective treatments:  None tried Associated symptoms: gum swelling   Risk factors: periodontal disease   Pt reports he has a dentist  Past Medical History  Diagnosis Date  . Chronic sciatica    History reviewed. No pertinent past surgical history. No family history on file. Social History  Substance Use Topics  . Smoking status: Current Every Day Smoker -- 0.50 packs/day    Types: Cigarettes  . Smokeless tobacco: None  . Alcohol Use: Yes     Comment: occ    Review of Systems  All other systems reviewed and are negative.     Allergies  Onion and Peanut-containing drug products  Home Medications   Prior to Admission medications   Medication Sig Start Date End Date Taking? Authorizing Provider  Dextromethorphan-Guaifenesin (MUCINEX FAST-MAX DM MAX) 5-100 MG/5ML LIQD Take 5-10 mLs by mouth daily as needed (for cold symptoms).   Yes Historical Provider, MD  Phenyleph-CPM-DM-APAP (ALKA-SELTZER PLUS COLD & FLU) 08-10-08-250 MG TBEF Take 1 tablet by mouth daily as needed (for cold symptoms).   Yes Historical Provider, MD  amoxicillin (AMOXIL) 500 MG capsule Take 1 capsule (500 mg total) by mouth 3 (three) times daily. 07/15/15   Elson Areas, PA-C  HYDROcodone-acetaminophen (NORCO/VICODIN)  5-325 MG tablet Take 2 tablets by mouth every 4 (four) hours as needed. 07/15/15   Elson Areas, PA-C  ibuprofen (ADVIL,MOTRIN) 800 MG tablet Take 1 tablet (800 mg total) by mouth 3 (three) times daily. 07/15/15   Elson Areas, PA-C   BP 109/73 mmHg  Pulse 77  Temp(Src) 98.3 F (36.8 C) (Oral)  Resp 16  Ht  (1.6 m)  Wt 68.04 kg  BMI 26.58 kg/m2  SpO2 100% Physical Exam  Constitutional: He is oriented to person, place, and time. He appears well-developed and well-nourished.  HENT:  Head: Normocephalic.  Right Ear: External ear normal.  Left Ear: External ear normal.  Nose: Nose normal.  Mouth/Throat: Oropharynx is clear and moist.  Eyes: Conjunctivae and EOM are normal. Pupils are equal, round, and reactive to light.  Neck: Normal range of motion.  Cardiovascular: Normal rate and normal heart sounds.   Pulmonary/Chest: Effort normal.  Abdominal: Soft. He exhibits no distension.  Musculoskeletal: Normal range of motion.  Neurological: He is alert and oriented to person, place, and time.  Psychiatric: He has a normal mood and affect.  Nursing note and vitals reviewed.   ED Course  Procedures (including critical care time) Labs Review Labs Reviewed - No data to display  Imaging Review No results found. I have personally reviewed and evaluated these images and lab results as part of my medical decision-making.   EKG Interpretation None      MDM   Final diagnoses:  Toothache    Meds ordered this encounter  Medications  .  DISCONTD: ibuprofen (ADVIL,MOTRIN) 200 MG tablet    Sig: Take 400 mg by mouth every 6 (six) hours as needed for moderate pain.  Marland Kitchen. amoxicillin (AMOXIL) 500 MG capsule    Sig: Take 1 capsule (500 mg total) by mouth 3 (three) times daily.    Dispense:  21 capsule    Refill:  0    Order Specific Question:  Supervising Provider    Answer:  MILLER, BRIAN [[id number]]  . HYDROcodone-acetaminophen (NORCO/VICODIN) 5-325 MG tablet    Sig: Take 2 tablets  by mouth every 4 (four) hours as needed.    Dispense:  10 tablet    Refill:  0    Order Specific Question:  Supervising Provider    Answer:  Hyacinth MeekerMILLER, BRIAN [[id number]]  . ibuprofen (ADVIL,MOTRIN) 800 MG tablet    Sig: Take 1 tablet (800 mg total) by mouth 3 (three) times daily.    Dispense:  21 tablet    Refill:  0    Order Specific Question:  Supervising Provider    Answer:  MILLER, BRIAN [[id number]]  . HYDROcodone-acetaminophen (NORCO/VICODIN) 5-325 MG per tablet 2 tablet    Sig:    See your Dentist for evaluation.   Lonia SkinnerLeslie K GrenelefeSofia, PA-C 07/15/15 1631  Vanetta MuldersScott Zackowski, MD 07/16/15 1714

## 2015-07-15 NOTE — Discharge Instructions (Signed)

## 2015-10-01 ENCOUNTER — Encounter (HOSPITAL_COMMUNITY): Payer: Self-pay | Admitting: Emergency Medicine

## 2015-10-01 ENCOUNTER — Emergency Department (HOSPITAL_COMMUNITY)
Admission: EM | Admit: 2015-10-01 | Discharge: 2015-10-01 | Disposition: A | Discharge status: Home / Self Care | Payer: Self-pay | Attending: Emergency Medicine | Admitting: Emergency Medicine

## 2015-10-01 DIAGNOSIS — H6012 Cellulitis of left external ear: Secondary | ICD-10-CM | POA: Insufficient documentation

## 2015-10-01 DIAGNOSIS — F17210 Nicotine dependence, cigarettes, uncomplicated: Secondary | ICD-10-CM | POA: Insufficient documentation

## 2015-10-01 MED ORDER — SULFAMETHOXAZOLE-TRIMETHOPRIM 800-160 MG PO TABS: 1.0000 | ORAL_TABLET | Freq: Two times a day (BID) | ORAL | Status: AC

## 2015-10-01 MED ORDER — DOXYCYCLINE HYCLATE 100 MG PO TABS: 100.0000 mg | ORAL_TABLET | Freq: Two times a day (BID) | ORAL | Status: DC

## 2015-10-01 MED ORDER — NAPROXEN 500 MG PO TABS: 500.0000 mg | ORAL_TABLET | Freq: Two times a day (BID) | ORAL | Status: AC

## 2015-10-01 NOTE — Discharge Instructions (Signed)
Cellulitis Cellulitis is an infection of the skin and the tissue beneath it. The infected area is usually red and tender. Cellulitis occurs most often in the arms and lower legs.  CAUSES  Cellulitis is caused by bacteria that enter the skin through cracks or cuts in the skin. The most common types of bacteria that cause cellulitis are staphylococci and streptococci. SIGNS AND SYMPTOMS   Redness and warmth.  Swelling.  Tenderness or pain.  Fever. DIAGNOSIS  Your health care provider can usually determine what is wrong based on a physical exam. Blood tests may also be done. TREATMENT  Treatment usually involves taking an antibiotic medicine. HOME CARE INSTRUCTIONS   Take your antibiotic medicine as directed by your health care provider. Finish the antibiotic even if you start to feel better.  Keep the infected arm or leg elevated to reduce swelling.  Apply a warm cloth to the affected area up to 4 times per day to relieve pain.  Take medicines only as directed by your health care provider.  Keep all follow-up visits as directed by your health care provider. SEEK MEDICAL CARE IF:   You notice red streaks coming from the infected area.  Your red area gets larger or turns dark in color.  Your bone or joint underneath the infected area becomes painful after the skin has healed.  Your infection returns in the same area or another area.  You notice a swollen bump in the infected area.  You develop new symptoms.  You have a fever. SEEK IMMEDIATE MEDICAL CARE IF:   You feel very sleepy.  You develop vomiting or diarrhea.  You have a general ill feeling (malaise) with muscle aches and pains.   This information is not intended to replace advice given to you by your health care provider. Make sure you discuss any questions you have with your health care provider.   Document Released: 01/05/2005 Document Revised: 12/17/2014 Document Reviewed: 06/13/2011 Elsevier Interactive  Patient Education 2016 [employer] Sulfamethoxazole; Trimethoprim, SMX-TMP tablets What is this medicine? SULFAMETHOXAZOLE; TRIMETHOPRIM or SMX-TMP (suhl fuh meth OK suh zohl; trye METH oh prim) is a combination of a sulfonamide antibiotic and a second antibiotic, trimethoprim. It is used to treat or prevent certain kinds of bacterial infections. It will not work for colds, flu, or other viral infections. This medicine may be used for other purposes; ask your health care provider or pharmacist if you have questions. What should I tell my health care provider before I take this medicine? They need to know if you have any of these conditions: -anemia -asthma -being treated with anticonvulsants -if you frequently drink alcohol containing drinks -kidney disease -liver disease -low level of folic acid or [id number] dehydrogenase -poor nutrition or malabsorption -porphyria -severe allergies -thyroid disorder -an unusual or allergic reaction to sulfamethoxazole, trimethoprim, sulfa drugs, other medicines, foods, dyes, or preservatives -pregnant or trying to get pregnant -breast-feeding How should I use this medicine? Take this medicine by mouth with a full glass of water. Follow the directions on the prescription label. Take your medicine at regular intervals. Do not take it more often than directed. Do not skip doses or stop your medicine early. Talk to your pediatrician regarding the use of this medicine in children. Special care may be needed. This medicine has been used in children as young as 7 months of age. Overdosage: If you think you have taken too much of this medicine contact a poison control center or emergency room at once. NOTE:  This medicine is only for you. Do not share this medicine with others. What if I miss a dose? If you miss a dose, take it as soon as you can. If it is almost time for your next dose, take only that dose. Do not take double or extra doses. What  may interact with this medicine? Do not take this medicine with any of the following medications: -aminobenzoate potassium -dofetilide -metronidazole This medicine may also interact with the following medications: -ACE inhibitors like benazepril, enalapril, lisinopril, and ramipril -birth control pills -cyclosporine -digoxin -diuretics -indomethacin -medicines for diabetes -methenamine -methotrexate -phenytoin -potassium supplements -pyrimethamine -sulfinpyrazone -tricyclic antidepressants -warfarin This list may not describe all possible interactions. Give your health care provider a list of all the medicines, herbs, non-prescription drugs, or dietary supplements you use. Also tell them if you smoke, drink alcohol, or use illegal drugs. Some items may interact with your medicine. What should I watch for while using this medicine? Tell your doctor or health care professional if your symptoms do not improve. Drink several glasses of water a day to reduce the risk of kidney problems. Do not treat diarrhea with over the counter products. Contact your doctor if you have diarrhea that lasts more than 2 days or if it is severe and watery. This medicine can make you more sensitive to the sun. Keep out of the sun. If you cannot avoid being in the sun, wear protective clothing and use a sunscreen. Do not use sun lamps or tanning beds/booths. What side effects may I notice from receiving this medicine? Side effects that you should report to your doctor or health care professional as soon as possible: -allergic reactions like skin rash or hives, swelling of the face, lips, or tongue -breathing problems -fever or chills, sore throat -irregular heartbeat, chest pain -joint or muscle pain -pain or difficulty passing urine -red pinpoint spots on skin -redness, blistering, peeling or loosening of the skin, including inside the mouth -unusual bleeding or bruising -unusually weak or  tired -yellowing of the eyes or skin Side effects that usually do not require medical attention (report to your doctor or health care professional if they continue or are bothersome): -diarrhea -dizziness -headache -loss of appetite -nausea, vomiting -nervousness This list may not describe all possible side effects. Call your doctor for medical advice about side effects. You may report side effects to FDA at 1-800-FDA-1088. Where should I keep my medicine? Keep out of the reach of children. Store at room temperature between 20 to 25 degrees C (68 to 77 degrees F). Protect from light. Throw away any unused medicine after the expiration date. NOTE: This sheet is a summary. It may not cover all possible information. If you have questions about this medicine, talk to your doctor, pharmacist, or health care provider.    2016, Elsevier/Gold Standard. (2012-11-02 14:38:26) Doxycycline tablets or capsules What is this medicine? DOXYCYCLINE (dox i SYE kleen) is a tetracycline antibiotic. It kills certain bacteria or stops their growth. It is used to treat many kinds of infections, like dental, skin, respiratory, and urinary tract infections. It also treats acne, Lyme disease, malaria, and certain sexually transmitted infections. This medicine may be used for other purposes; ask your health care provider or pharmacist if you have questions. What should I tell my health care provider before I take this medicine? They need to know if you have any of these conditions: -liver disease -long exposure to sunlight like working outdoors -stomach problems like colitis -an unusual  or allergic reaction to doxycycline, tetracycline antibiotics, other medicines, foods, dyes, or preservatives -pregnant or trying to get pregnant -breast-feeding How should I use this medicine? Take this medicine by mouth with a full glass of water. Follow the directions on the prescription label. It is best to take this medicine  without food, but if it upsets your stomach take it with food. Take your medicine at regular intervals. Do not take your medicine more often than directed. Take all of your medicine as directed even if you think you are better. Do not skip doses or stop your medicine early. Talk to your pediatrician regarding the use of this medicine in children. While this drug may be prescribed for selected conditions, precautions do apply. Overdosage: If you think you have taken too much of this medicine contact a poison control center or emergency room at once. NOTE: This medicine is only for you. Do not share this medicine with others. What if I miss a dose? If you miss a dose, take it as soon as you can. If it is almost time for your next dose, take only that dose. Do not take double or extra doses. What may interact with this medicine? -antacids -barbiturates -birth control pills -bismuth subsalicylate -carbamazepine -methoxyflurane -other antibiotics -phenytoin -vitamins that contain iron -warfarin This list may not describe all possible interactions. Give your health care provider a list of all the medicines, herbs, non-prescription drugs, or dietary supplements you use. Also tell them if you smoke, drink alcohol, or use illegal drugs. Some items may interact with your medicine. What should I watch for while using this medicine? Tell your doctor or health care professional if your symptoms do not improve. Do not treat diarrhea with over the counter products. Contact your doctor if you have diarrhea that lasts more than 2 days or if it is severe and watery. Do not take this medicine just before going to bed. It may not dissolve properly when you lay down and can cause pain in your throat. Drink plenty of fluids while taking this medicine to also help reduce irritation in your throat. This medicine can make you more sensitive to the sun. Keep out of the sun. If you cannot avoid being in the sun, wear  protective clothing and use sunscreen. Do not use sun lamps or tanning beds/booths. Birth control pills may not work properly while you are taking this medicine. Talk to your doctor about using an extra method of birth control. If you are being treated for a sexually transmitted infection, avoid sexual contact until you have finished your treatment. Your sexual partner may also need treatment. Avoid antacids, aluminum, calcium, magnesium, and iron products for 4 hours before and 2 hours after taking a dose of this medicine. If you are using this medicine to prevent malaria, you should still protect yourself from contact with mosquitos. Stay in screened-in areas, use mosquito nets, keep your body covered, and use an insect repellent. What side effects may I notice from receiving this medicine? Side effects that you should report to your doctor or health care professional as soon as possible: -allergic reactions like skin rash, itching or hives, swelling of the face, lips, or tongue -difficulty breathing -fever -itching in the rectal or genital area -pain on swallowing -redness, blistering, peeling or loosening of the skin, including inside the mouth -severe stomach pain or cramps -unusual bleeding or bruising Naproxen and naproxen sodium oral immediate-release tablets What is this medicine? NAPROXEN (na PROX en) is a  non-steroidal anti-inflammatory drug (NSAID). It is used to reduce swelling and to treat pain. This medicine may be used for dental pain, headache, or painful monthly periods. It is also used for painful joint and muscular problems such as arthritis, tendinitis, bursitis, and gout. This medicine may be used for other purposes; ask your health care provider or pharmacist if you have questions. What should I tell my health care provider before I take this medicine? They need to know if you have any of these conditions: -asthma -cigarette smoker -drink more than 3 alcohol containing  drinks a day -heart disease or circulation problems such as heart failure or leg edema (fluid retention) -high blood pressure -kidney disease -liver disease -stomach bleeding or ulcers -an unusual or allergic reaction to naproxen, aspirin, other NSAIDs, other medicines, foods, dyes, or preservatives -pregnant or trying to get pregnant -breast-feeding How should I use this medicine? Take this medicine by mouth with a glass of water. Follow the directions on the prescription label. Take it with food if your stomach gets upset. Try to not lie down for at least 10 minutes after you take it. Take your medicine at regular intervals. Do not take your medicine more often than directed. Long-term, continuous use may increase the risk of heart attack or stroke. A special MedGuide will be given to you by the pharmacist with each prescription and refill. Be sure to read this information carefully each time. Talk to your pediatrician regarding the use of this medicine in children. Special care may be needed. Overdosage: If you think you have taken too much of this medicine contact a poison control center or emergency room at once. NOTE: This medicine is only for you. Do not share this medicine with others. What if I miss a dose? If you miss a dose, take it as soon as you can. If it is almost time for your next dose, take only that dose. Do not take double or extra doses. What may interact with this medicine? -alcohol -aspirin -cidofovir -diuretics -lithium -methotrexate -other drugs for inflammation like ketorolac or prednisone -pemetrexed -probenecid -warfarin This list may not describe all possible interactions. Give your health care provider a list of all the medicines, herbs, non-prescription drugs, or dietary supplements you use. Also tell them if you smoke, drink alcohol, or use illegal drugs. Some items may interact with your medicine. What should I watch for while using this medicine? Tell  your doctor or health care professional if your pain does not get better. Talk to your doctor before taking another medicine for pain. Do not treat yourself. This medicine does not prevent heart attack or stroke. In fact, this medicine may increase the chance of a heart attack or stroke. The chance may increase with longer use of this medicine and in people who have heart disease. If you take aspirin to prevent heart attack or stroke, talk with your doctor or health care professional. Do not take other medicines that contain aspirin, ibuprofen, or naproxen with this medicine. Side effects such as stomach upset, nausea, or ulcers may be more likely to occur. Many medicines available without a prescription should not be taken with this medicine. This medicine can cause ulcers and bleeding in the stomach and intestines at any time during treatment. Do not smoke cigarettes or drink alcohol. These increase irritation to your stomach and can make it more susceptible to damage from this medicine. Ulcers and bleeding can happen without warning symptoms and can cause death. You may get  drowsy or dizzy. Do not drive, use machinery, or do anything that needs mental alertness until you know how this medicine affects you. Do not stand or sit up quickly, especially if you are an older patient. This reduces the risk of dizzy or fainting spells. This medicine can cause you to bleed more easily. Try to avoid damage to your teeth and gums when you brush or floss your teeth. What side effects may I notice from receiving this medicine? Side effects that you should report to your doctor or health care professional as soon as possible: -black or bloody stools, blood in the urine or vomit -blurred vision -chest pain -difficulty breathing or wheezing -nausea or vomiting -severe stomach pain -skin rash, skin redness, blistering or peeling skin, hives, or itching -slurred speech or weakness on one side of the body -swelling  of eyelids, throat, lips -unexplained weight gain or swelling -unusually weak or tired -yellowing of eyes or skin Side effects that usually do not require medical attention (report to your doctor or health care professional if they continue or are bothersome): -constipation -headache -heartburn This list may not describe all possible side effects. Call your doctor for medical advice about side effects. You may report side effects to FDA at 1-800-FDA-1088. Where should I keep my medicine? Keep out of the reach of children. Store at room temperature between 15 and 30 degrees C (59 and 86 degrees F). Keep container tightly closed. Throw away any unused medicine after the expiration date. NOTE: This sheet is a summary. It may not cover all possible information. If you have questions about this medicine, talk to your doctor, pharmacist, or health care provider.    2016, Elsevier/Gold Standard. (2009-03-30 20:10:16) -unusually weak or tired -yellowing of the eyes or skin Side effects that usually do not require medical attention (report to your doctor or health care professional if they continue or are bothersome): -diarrhea -loss of appetite -nausea, vomiting This list may not describe all possible side effects. Call your doctor for medical advice about side effects. You may report side effects to FDA at 1-800-FDA-1088. Where should I keep my medicine? Keep out of the reach of children. Store at room temperature, below 30 degrees C (86 degrees F). Protect from light. Keep container tightly closed. Throw away any unused medicine after the expiration date. Taking this medicine after the expiration date can make you seriously ill. NOTE: This sheet is a summary. It may not cover all possible information. If you have questions about this medicine, talk to your doctor, pharmacist, or health care provider.    2016, Elsevier/[employer] Standard. (2014-07-18 12:10:28)

## 2015-10-01 NOTE — ED Notes (Signed)
Pt reports LT ear otalgia x 2 days. Denies any drainage.

## 2015-10-01 NOTE — ED Provider Notes (Signed)
CSN: [id number]     Arrival date & time 10/01/15  1658 History   First MD Initiated Contact with Patient 10/01/15 1706     Chief Complaint  Patient presents with  . Otalgia     (Consider location/radiation/quality/duration/timing/severity/associated sxs/prior Treatment) HPI  Kyle Beasley PainG Yousuf This is a 35 year old male who presents emergency Department with left year pain. Patient states that he has pain in the left pinna. It began 3 days ago. He has had worsening swelling and tenderness in the ear. He states he can't even lay on that side of his ear. He denies drainage, mastoid tenderness, fevers, headaches, or any other complaints. He is able to hear out of the ear. He denies pain on the inside of the ear.  Past Medical History  Diagnosis Date  . Chronic sciatica    History reviewed. No pertinent past surgical history. No family history on file. Social History  Substance Use Topics  . Smoking status: Current Every Day Smoker -- 0.50 packs/day    Types: Cigarettes  . Smokeless tobacco: None  . Alcohol Use: Yes     Comment: occ    Review of Systems  Constitutional: Negative for fever and chills.  HENT: Positive for ear pain.   Neurological: Negative for headaches.      Allergies  Onion and Peanut-containing drug products  Home Medications   Prior to Admission medications   Medication Sig Start Date End Date Taking? Authorizing Provider  amoxicillin (AMOXIL) 500 MG capsule Take 1 capsule (500 mg total) by mouth 3 (three) times daily. 07/15/15   Elson AreasLeslie K Sofia, PA-C  Dextromethorphan-Guaifenesin (MUCINEX FAST-MAX DM MAX) 5-100 MG/5ML LIQD Take 5-10 mLs by mouth daily as needed (for cold symptoms).    Historical Provider, MD  HYDROcodone-acetaminophen (NORCO/VICODIN) 5-325 MG tablet Take 2 tablets by mouth every 4 (four) hours as needed. 07/15/15   Elson AreasLeslie K Sofia, PA-C  ibuprofen (ADVIL,MOTRIN) 800 MG tablet Take 1 tablet (800 mg total) by mouth 3 (three) times daily. 07/15/15    Elson AreasLeslie K Sofia, PA-C  Phenyleph-CPM-DM-APAP (ALKA-SELTZER PLUS COLD & FLU) 08-10-08-250 MG TBEF Take 1 tablet by mouth daily as needed (for cold symptoms).    Historical Provider, MD   BP 135/90 mmHg  Pulse 75  Temp(Src) 98.4 F (36.9 C) (Oral)  Resp 18  Ht 5\' 8"  (1.727 m)  Wt 72.576 kg  BMI 24.33 kg/m2  SpO2 99% Physical Exam  Constitutional: He appears well-developed and well-nourished. No distress.  HENT:  Head: Normocephalic and atraumatic.  Right Ear: Hearing, tympanic membrane, external ear and ear canal normal.  Left Ear: Hearing, tympanic membrane and ear canal normal. There is tenderness. No mastoid tenderness.  Ears:  Eyes: Conjunctivae are normal. No scleral icterus.  Neck: Normal range of motion. Neck supple.  Cardiovascular: Normal rate, regular rhythm and normal heart sounds.   Pulmonary/Chest: Effort normal and breath sounds normal. No respiratory distress.  Abdominal: Soft. There is no tenderness.  Musculoskeletal: He exhibits no edema.  Neurological: He is alert.  Skin: Skin is warm and dry. He is not diaphoretic.  Psychiatric: His behavior is normal.  Nursing note and vitals reviewed.   ED Course  Procedures (including critical care time) Labs Review Labs Reviewed - No data to display  Imaging Review No results found. I have personally reviewed and evaluated these images and lab results as part of my medical decision-making.   EKG Interpretation None      MDM   Final diagnoses:  None  Patient with history of cystic acne with what appears to be an infected cyst or blackhead of the pinna of the left ear. It is tender to palpation. The redness and swelling is all localized to the pinna without streaking, or extension. Patient discharged with doxycycline and a coupon from [facility] as I feel it is the optimal treatment for both cellulitis of his ear and his cystic acne. I have also discharge the patient with Bactrim. If he is unable to afford the  doxycycline. Naproxen for pain. Advised follow-up with ear, nose and throat if symptoms are worsening. Also discussed return precautions and reasons to seek immediate care at the emergency department. I answered all questions as thoroughly as possible. I discussed possibility of easy sunburns with doxycycline, especially in the summertime and need for good skin protection. Patient appears safe for discharge at this time.    Arthor Captainbigail Jacqulynn Shappell, PA-C 10/01/15 1730  Linwood DibblesJon Knapp, MD 10/01/15 [id number]

## 2016-02-24 ENCOUNTER — Encounter (HOSPITAL_COMMUNITY): Payer: Self-pay | Admitting: Emergency Medicine

## 2016-02-24 ENCOUNTER — Emergency Department (HOSPITAL_COMMUNITY): Payer: Self-pay

## 2016-02-24 ENCOUNTER — Emergency Department (HOSPITAL_COMMUNITY)
Admission: EM | Admit: 2016-02-24 | Discharge: 2016-02-24 | Disposition: A | Discharge status: Home / Self Care | Payer: Self-pay | Attending: Emergency Medicine | Admitting: Emergency Medicine

## 2016-02-24 DIAGNOSIS — W1839XA Other fall on same level, initial encounter: Secondary | ICD-10-CM | POA: Insufficient documentation

## 2016-02-24 DIAGNOSIS — Z91010 Allergy to peanuts: Secondary | ICD-10-CM | POA: Insufficient documentation

## 2016-02-24 DIAGNOSIS — Y999 Unspecified external cause status: Secondary | ICD-10-CM | POA: Insufficient documentation

## 2016-02-24 DIAGNOSIS — Y9301 Activity, walking, marching and hiking: Secondary | ICD-10-CM | POA: Insufficient documentation

## 2016-02-24 DIAGNOSIS — F17210 Nicotine dependence, cigarettes, uncomplicated: Secondary | ICD-10-CM | POA: Insufficient documentation

## 2016-02-24 DIAGNOSIS — Y9289 Other specified places as the place of occurrence of the external cause: Secondary | ICD-10-CM | POA: Insufficient documentation

## 2016-02-24 DIAGNOSIS — Z79899 Other long term (current) drug therapy: Secondary | ICD-10-CM | POA: Insufficient documentation

## 2016-02-24 DIAGNOSIS — M25562 Pain in left knee: Secondary | ICD-10-CM | POA: Insufficient documentation

## 2016-02-24 MED ORDER — OXYCODONE-ACETAMINOPHEN 5-325 MG PO TABS: 1.0000 | ORAL_TABLET | Freq: Three times a day (TID) | ORAL | 0 refills | Status: AC | PRN

## 2016-02-24 MED ORDER — OXYCODONE-ACETAMINOPHEN 5-325 MG PO TABS
1.0000 | ORAL_TABLET | Freq: Once | ORAL | Status: AC
  Administered 2016-02-24: 1 via ORAL
  Filled 2016-02-24: qty 1

## 2016-02-24 NOTE — ED Notes (Signed)
Pt made aware to return if symptoms worsen or if any life threatening symptoms occur.   

## 2016-02-24 NOTE — Discharge Instructions (Signed)
Please read and follow all provided instructions.  Your diagnoses today include:  1. Acute pain of left knee     Tests performed today include: Vital signs. See below for your results today.   Medications prescribed:  Take as prescribed   Home care instructions:  Follow any educational materials contained in this packet.  Follow-up instructions: Please follow-up with your Orthopedics for further evaluation of symptoms and treatment   Return instructions:  Please return to the Emergency Department if you do not get better, if you get worse, or new symptoms OR  - Fever (temperature greater than 101.25F)  - Bleeding that does not stop with holding pressure to the area    -Severe pain (please note that you may be more sore the day after your accident)  - Chest Pain  - Difficulty breathing  - Severe nausea or vomiting  - Inability to tolerate food and liquids  - Passing out  - Skin becoming red around your wounds  - Change in mental status (confusion or lethargy)  - New numbness or weakness    Please return if you have any other emergent concerns.  Additional Information:  Your vital signs today were: BP 129/89 (BP Location: Left Arm)    Pulse 79    Temp 98.2 F (36.8 C) (Oral)    Resp 16    Ht 5\' 6"  (1.676 m)    Wt 72.6 kg    SpO2 100%    BMI 25.82 kg/m  If your blood pressure (BP) was elevated above 135/85 this visit, please have this repeated by your doctor within one month. ---------------

## 2016-02-24 NOTE — ED Triage Notes (Signed)
Pt reports stepping of a curb yesterday and knee popped, causing pt to fall to ground, no head injury.  Pt has moderate swelling to left knee at this time.

## 2016-02-24 NOTE — ED Provider Notes (Signed)
[facility]-EMERGENCY DEPT Provider Note   CSN: [id number] Arrival date & time: 02/24/16  1238  History   Chief Complaint Chief Complaint  Patient presents with  . Knee Injury    HPI Kyle Beasley is a 35 y.o. male.  HPI  35 y.o. male presents to the Emergency Department today complaining of left knee pain yesterday. Pt states that he was ambulating on the side walk when he stepped off the curb and felt a "pop." noted immediate pain. This pain caused the patient to pass out. No head trauma. Notes pain currently 10/10 in Left knee. No numbness/tingling. No headaches currently. No N/V. No fevers. No CP/SOB/ABD pain. States pain unrelieved with OTC remedies. Rates pain 10/10. No other symptoms noted.   Past Medical History:  Diagnosis Date  . Chronic sciatica     There are no active problems to display for this patient.   History reviewed. No pertinent surgical history.     Home Medications    Prior to Admission medications   Medication Sig Start Date End Date Taking? Authorizing Provider  doxycycline (VIBRA-TABS) 100 MG tablet Take 1 tablet (100 mg total) by mouth 2 (two) times daily. 10/01/15   Arthor CaptainAbigail Harris, PA-C  naproxen (NAPROSYN) 500 MG tablet Take 1 tablet (500 mg total) by mouth 2 (two) times daily with a meal. 10/01/15   Arthor CaptainAbigail Harris, PA-C    Family History History reviewed. No pertinent family history.  Social History Social History  Substance Use Topics  . Smoking status: Current Every Day Smoker    Packs/day: 0.50    Types: Cigarettes  . Smokeless tobacco: Not on file  . Alcohol use Yes     Comment: occ     Allergies   Onion and Peanut-containing drug products   Review of Systems Review of Systems  Constitutional: Negative for fever.  Respiratory: Negative for shortness of breath.   Cardiovascular: Negative for chest pain.  Gastrointestinal: Negative for nausea and vomiting.  Musculoskeletal: Positive for arthralgias.  Neurological: Negative for  headaches.   Physical Exam Updated Vital Signs BP 129/89 (BP Location: Left Arm)   Pulse 79   Temp 98.2 F (36.8 C) (Oral)   Resp 16   Ht 5\' 6"  (1.676 m)   Wt 72.6 kg   SpO2 100%   BMI 25.82 kg/m   Physical Exam  Constitutional: He is oriented to person, place, and time. Vital signs are normal. He appears well-developed and well-nourished.  HENT:  Head: Normocephalic.  Right Ear: Hearing normal.  Left Ear: Hearing normal.  Eyes: Conjunctivae and EOM are normal. Pupils are equal, round, and reactive to light.  Neck: Normal range of motion. Neck supple.  Cardiovascular: Normal rate, regular rhythm, normal heart sounds and intact distal pulses.   Pulmonary/Chest: Effort normal and breath sounds normal.  Musculoskeletal:  Negative anterior/poster drawer bilaterally. Noted varus laxity. No crepitus. Pain with flexion and extension. TTP on medial and lateral aspect. Swelling noted. No erythema. Distal pulses appreciated. NVI.   Neurological: He is alert and oriented to person, place, and time.  Skin: Skin is warm and dry.  Psychiatric: He has a normal mood and affect. His speech is normal and behavior is normal. Thought content normal.  Nursing note and vitals reviewed.  ED Treatments / Results  Labs (all labs ordered are listed, but only abnormal results are displayed) Labs Reviewed - No data to display  EKG  EKG Interpretation None      Radiology Dg Knee Complete 4  Views Left  Result Date: 02/24/2016 CLINICAL DATA:  Patient stepped off curb yesterday and felt pain. EXAM: LEFT KNEE - COMPLETE 4+ VIEW COMPARISON:  None. FINDINGS: No evidence of fracture, or dislocation. Moderate-sized joint effusion. No significant joint space narrowing. No radiopaque foreign body. IMPRESSION: Positive for effusion.  No fracture or dislocation. Electronically Signed   By: Elsie StainJohn T Curnes M.D.   On: 02/24/2016 13:17    Procedures Procedures (including critical care time)  Medications  Ordered in ED Medications - No data to display   Initial Impression / Assessment and Plan / ED Course  I have reviewed the triage vital signs and the nursing notes.  Pertinent labs & imaging results that were available during my care of the patient were reviewed by me and considered in my medical decision making (see chart for details).  Clinical Course    Final Clinical Impressions(s) / ED Diagnoses  I have reviewed and evaluated the relevant imaging studies.  I have reviewed the relevant previous healthcare records. I obtained HPI from historian.  ED Course:  Assessment: Pt is a 35yM who presents with left knee pain s/p mechanical fall. On exam, pt in NAD. Nontoxic/nonseptic appearing. VSS. Afebrile. Lungs CTA. Heart RRR. Left knee with minimal swelling noted. Pain on ROM. No erythema. No signs of infection. XR negative. Did show effusion, Possible LCL vs MCL injury. Given knee immobilizer and crutches in ED. I have [place] the [place]  Cont[place] Sub[place]. Given Rx #10 Percocet. Plan is to DC home and follow up with Ortho. At time of discharge, Patient is in no acute distress. Vital Signs are stable. Patient is able to ambulate. Patient able to tolerate PO.   Disposition/Plan:  DC Home Additional Verbal discharge instructions given and discussed with patient.  Pt Instructed to f/u with Ortho in the next week for evaluation and treatment of symptoms. Return precautions given Pt acknowledges and agrees with plan  Supervising Physician Samuel JesterKathleen McManus, DO   Final diagnoses:  Acute pain of left knee    New Prescriptions New Prescriptions   No medications on file     Audry Piliyler Emberleigh Reily, PA-C 02/24/16 1330    Samuel JesterKathleen McManus, DO 02/24/16 1621

## 2016-03-15 ENCOUNTER — Ambulatory Visit: Payer: Self-pay | Admitting: Orthopaedic Surgery

## 2017-08-18 IMAGING — DX DG KNEE COMPLETE 4+V*L*
4 series · 4 of 4 positions shown · non-contrast
Comparison: None.

CLINICAL DATA: Patient stepped off curb yesterday and felt pain.

EXAM:
LEFT KNEE - COMPLETE 4+ VIEW

[knee ap (1 of 3)]
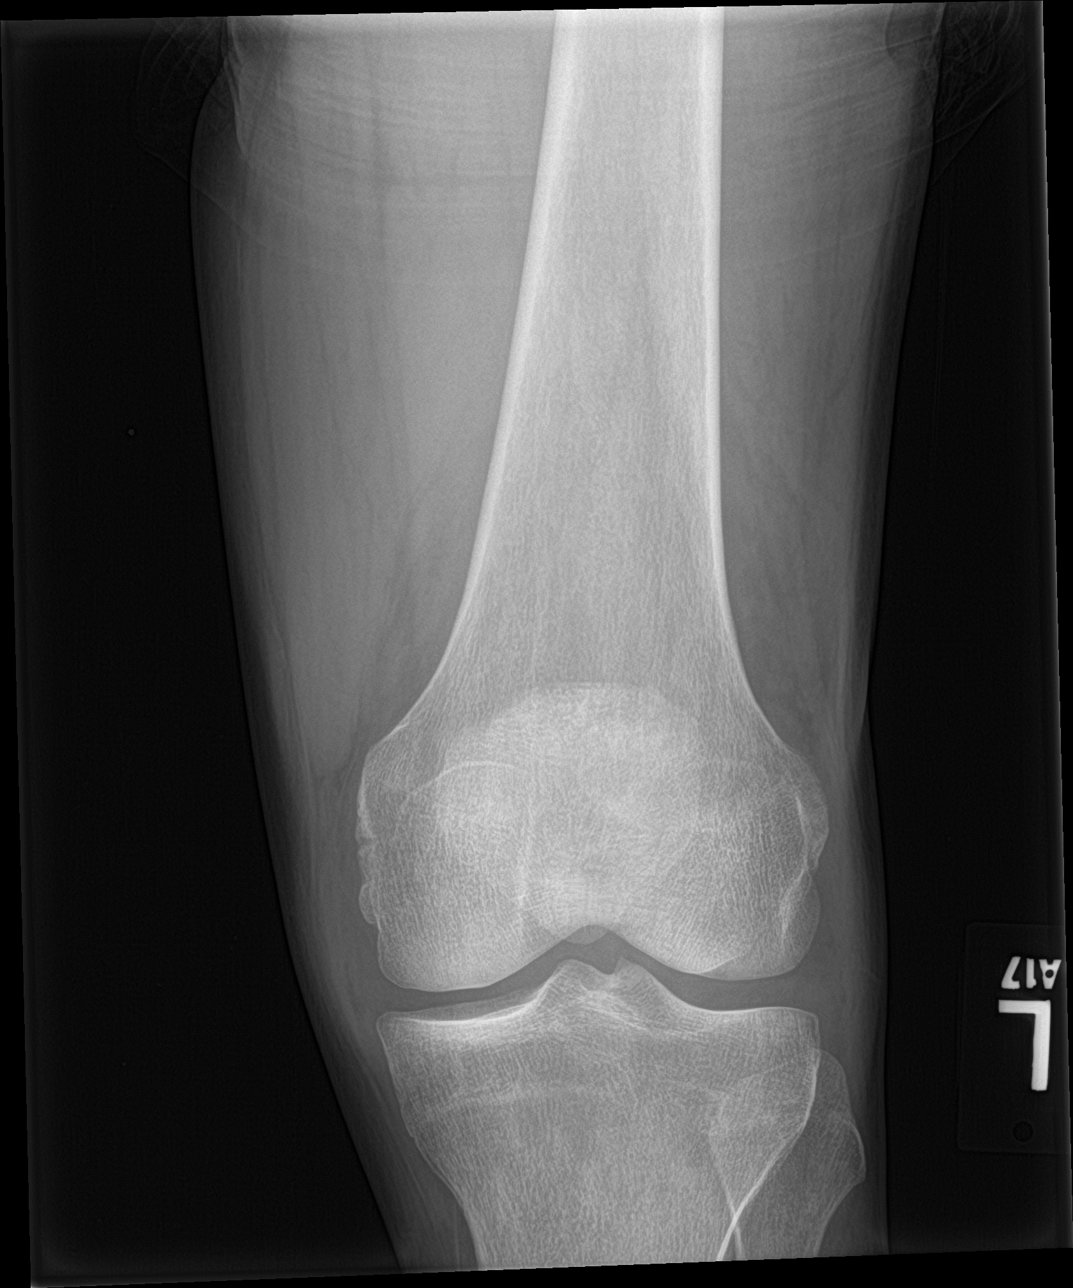

[knee lat]
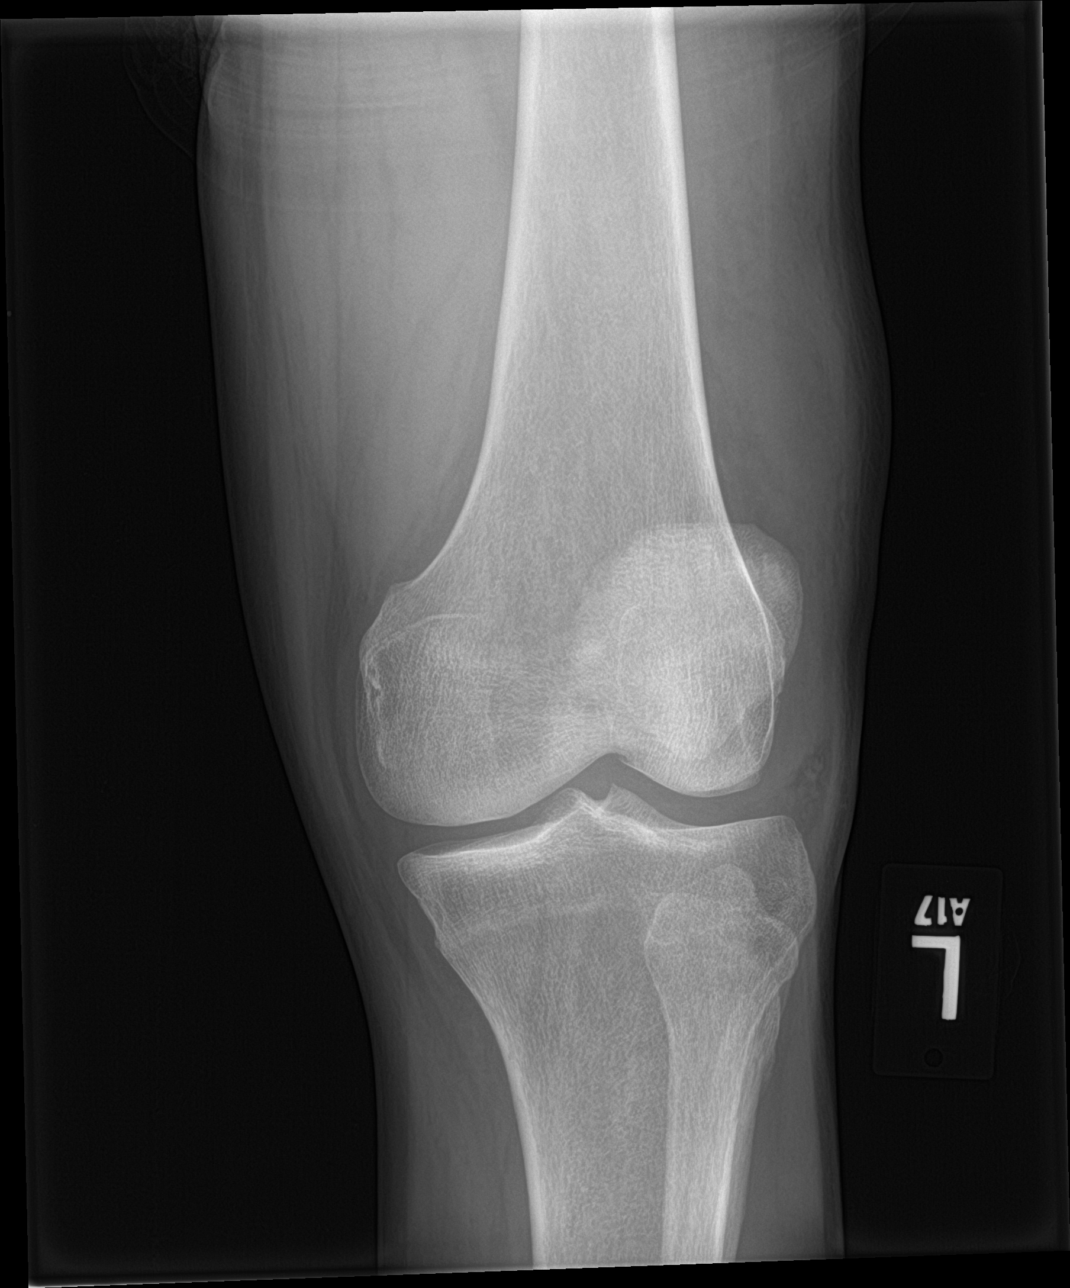

[knee ap (2 of 3)]
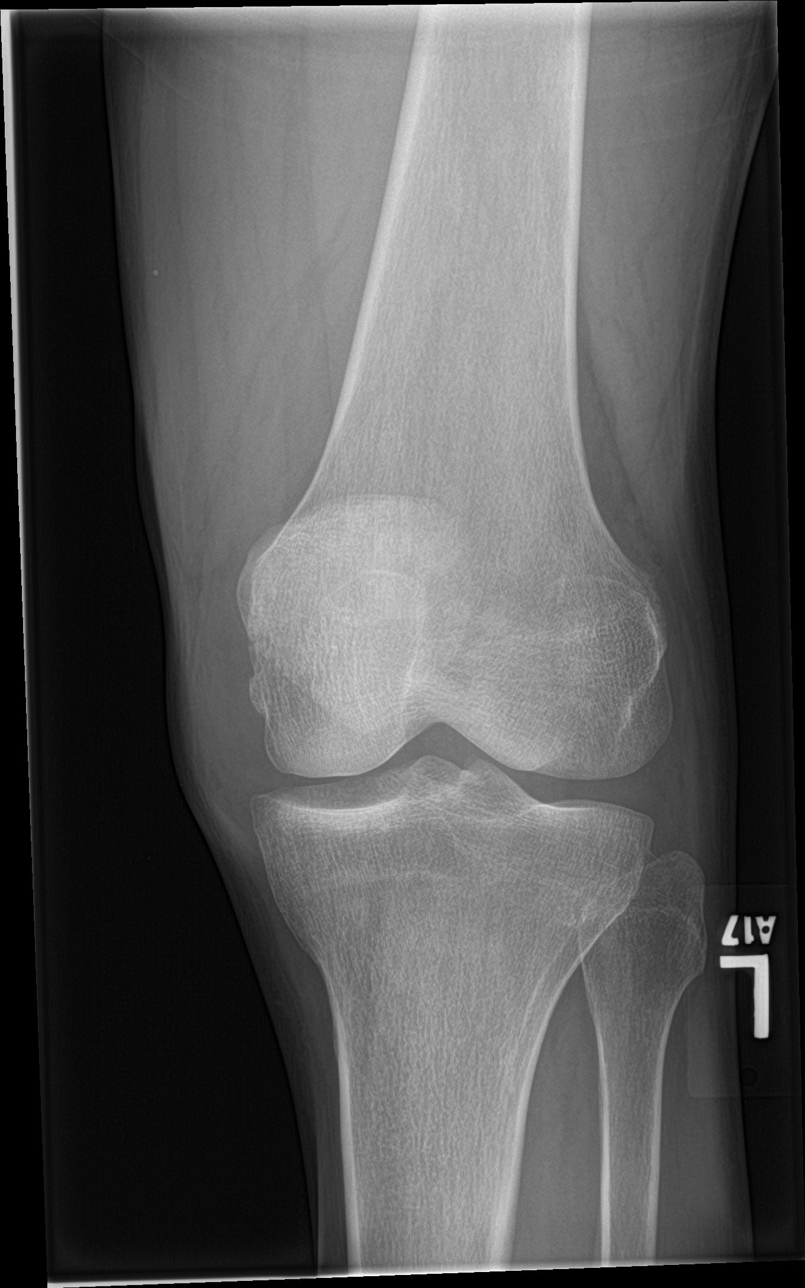

[knee ap (3 of 3)]
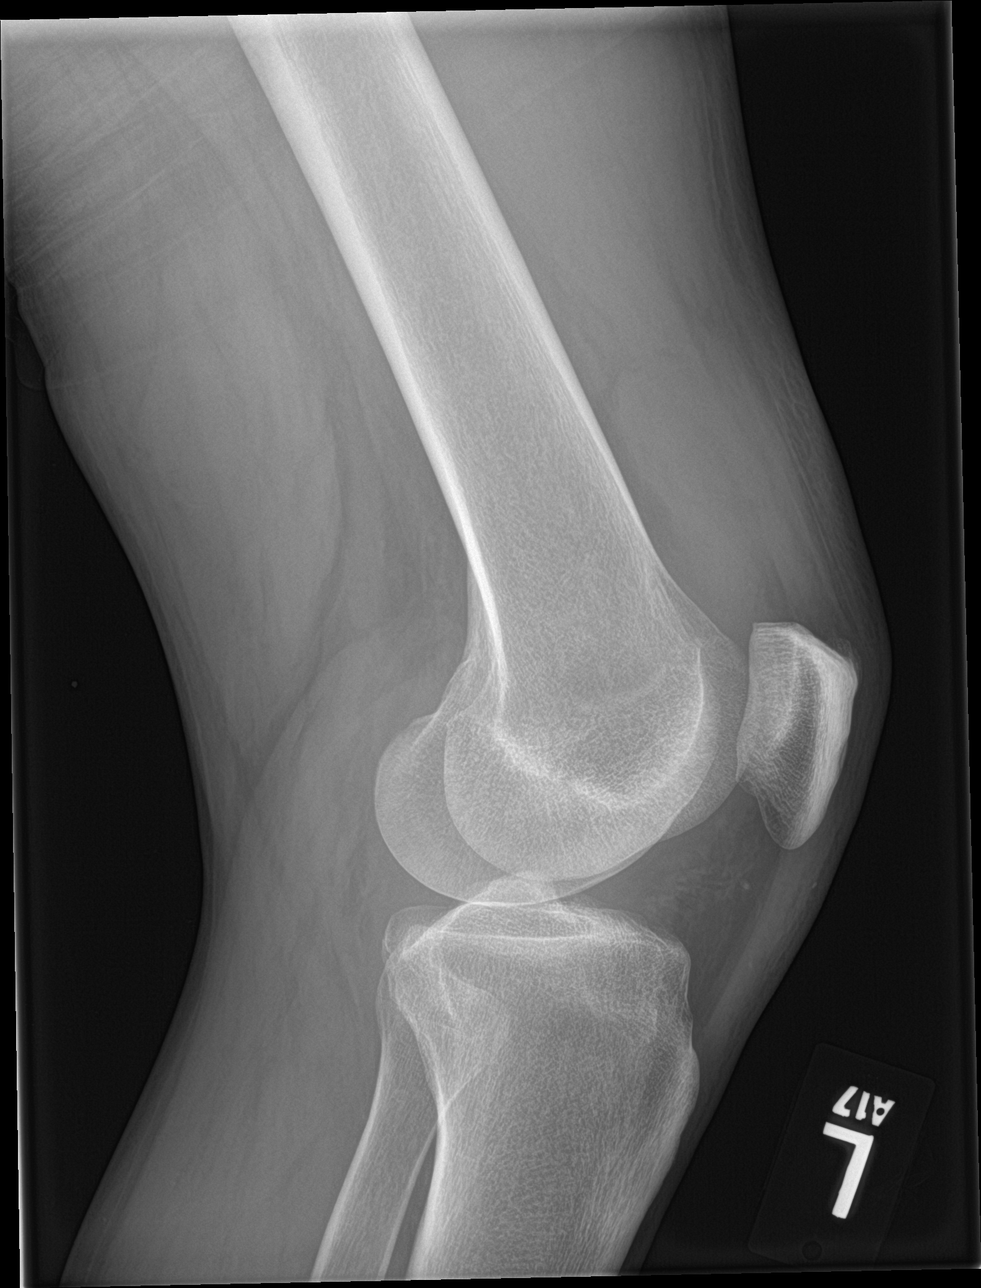

[4 of 4 positions shown; findings below may reference images not displayed]

FINDINGS: No evidence of fracture, or dislocation. Moderate-sized joint
effusion. No significant joint space narrowing. No radiopaque
foreign body.
IMPRESSION: Positive for effusion.  No fracture or dislocation.

## 2021-08-07 DIAGNOSIS — R079 Chest pain, unspecified: Secondary | ICD-10-CM | POA: Diagnosis not present

## 2021-08-07 DIAGNOSIS — R0789 Other chest pain: Secondary | ICD-10-CM | POA: Diagnosis not present

## 2021-08-07 DIAGNOSIS — Z743 Need for continuous supervision: Secondary | ICD-10-CM | POA: Diagnosis not present

## 2021-08-07 DIAGNOSIS — R531 Weakness: Secondary | ICD-10-CM | POA: Diagnosis not present

## 2021-08-07 DIAGNOSIS — R42 Dizziness and giddiness: Secondary | ICD-10-CM | POA: Diagnosis not present

## 2021-08-07 DIAGNOSIS — I10 Essential (primary) hypertension: Secondary | ICD-10-CM | POA: Diagnosis not present

## 2022-05-25 DIAGNOSIS — L03115 Cellulitis of right lower limb: Secondary | ICD-10-CM | POA: Diagnosis not present

## 2022-05-25 DIAGNOSIS — M79604 Pain in right leg: Secondary | ICD-10-CM | POA: Diagnosis not present

## 2022-05-25 DIAGNOSIS — L03116 Cellulitis of left lower limb: Secondary | ICD-10-CM | POA: Diagnosis not present

## 2022-05-25 DIAGNOSIS — R531 Weakness: Secondary | ICD-10-CM | POA: Diagnosis not present

## 2022-05-25 DIAGNOSIS — Z743 Need for continuous supervision: Secondary | ICD-10-CM | POA: Diagnosis not present

## 2022-05-25 DIAGNOSIS — R609 Edema, unspecified: Secondary | ICD-10-CM | POA: Diagnosis not present

## 2022-05-25 DIAGNOSIS — I891 Lymphangitis: Secondary | ICD-10-CM | POA: Diagnosis not present

## 2022-05-29 ENCOUNTER — Other Ambulatory Visit: Payer: Self-pay

## 2022-05-29 ENCOUNTER — Emergency Department (HOSPITAL_COMMUNITY)
Admission: EM | Admit: 2022-05-29 | Discharge: 2022-05-29 | Disposition: A | Discharge status: Home / Self Care | Payer: 59 | Attending: Emergency Medicine | Admitting: Emergency Medicine

## 2022-05-29 ENCOUNTER — Encounter (HOSPITAL_COMMUNITY): Payer: Self-pay

## 2022-05-29 DIAGNOSIS — M79604 Pain in right leg: Secondary | ICD-10-CM | POA: Diagnosis not present

## 2022-05-29 LAB — CBC WITH DIFFERENTIAL/PLATELET
Abs Immature Granulocytes: 0.05 10*3/uL (ref 0.00–0.07)
Basophils Absolute: 0 10*3/uL (ref 0.0–0.1)
Basophils Relative: 0 %
Eosinophils Absolute: 0.1 10*3/uL (ref 0.0–0.5)
Eosinophils Relative: 2 %
HCT: 40.6 % (ref 39.0–52.0)
Hemoglobin: 14 g/dL (ref 13.0–17.0)
Immature Granulocytes: 1 %
Lymphocytes Relative: 23 %
Lymphs Abs: 2.1 10*3/uL (ref 0.7–4.0)
MCH: 31 pg (ref 26.0–34.0)
MCHC: 34.5 g/dL (ref 30.0–36.0)
MCV: 89.8 fL (ref 80.0–100.0)
Monocytes Absolute: 1 10*3/uL (ref 0.1–1.0)
Monocytes Relative: 11 %
Neutro Abs: 5.7 10*3/uL (ref 1.7–7.7)
Neutrophils Relative %: 63 %
Platelets: 393 10*3/uL (ref 150–400)
RBC: 4.52 MIL/uL (ref 4.22–5.81)
RDW: 12.6 % (ref 11.5–15.5)
WBC: 9 10*3/uL (ref 4.0–10.5)
nRBC: 0 % (ref 0.0–0.2)

## 2022-05-29 LAB — COMPREHENSIVE METABOLIC PANEL
ALT: 27 U/L (ref 0–44)
AST: 27 U/L (ref 15–41)
Albumin: 3.8 g/dL (ref 3.5–5.0)
Alkaline Phosphatase: 69 U/L (ref 38–126)
Anion gap: 12 (ref 5–15)
BUN: 6 mg/dL (ref 6–20)
CO2: 28 mmol/L (ref 22–32)
Calcium: 8.8 mg/dL — ABNORMAL LOW (ref 8.9–10.3)
Chloride: 93 mmol/L — ABNORMAL LOW (ref 98–111)
Creatinine, Ser: 1.02 mg/dL (ref 0.61–1.24)
GFR, Estimated: >60 mL/min (ref >60)
Glucose, Bld: 97 mg/dL (ref 70–99)
Potassium: 3.1 mmol/L — ABNORMAL LOW (ref 3.5–5.1)
Sodium: 133 mmol/L — ABNORMAL LOW (ref 135–145)
Total Bilirubin: 0.8 mg/dL (ref 0.3–1.2)
Total Protein: 8.4 g/dL — ABNORMAL HIGH (ref 6.5–8.1)

## 2022-05-29 MED ORDER — VANCOMYCIN HCL IN DEXTROSE 1-5 GM/200ML-% IV SOLN
1000.0000 mg | Freq: Once | INTRAVENOUS | Status: AC
  Administered 2022-05-29: 1000 mg via INTRAVENOUS
  Filled 2022-05-29: qty 200

## 2022-05-29 MED ORDER — DOXYCYCLINE HYCLATE 100 MG PO CAPS: 100.0000 mg | ORAL_CAPSULE | Freq: Two times a day (BID) | ORAL | 0 refills | Status: AC

## 2022-05-29 NOTE — ED Provider Notes (Signed)
[facility] Provider Note   CSN: [id number] Arrival date & time: 05/29/22  1903     History {Add pertinent medical, surgical, social history, OB history to HPI:1} Chief Complaint  Patient presents with   Leg Pain    Kyle Beasley is a 42 y.o. male.  Patient has a history of a cellulitis to her right leg.  Patient was started on sulfa few days ago and the redness has improved but he still has a lot of swelling and pain and he is concerned about a blood clot   Leg Pain      Home Medications Prior to Admission medications   Medication Sig Start Date End Date Taking? Authorizing Provider  doxycycline (VIBRAMYCIN) 100 MG capsule Take 1 capsule (100 mg total) by mouth 2 (two) times daily. One po bid x 7 days 05/29/22  Yes Milton Ferguson, MD  naproxen (NAPROSYN) 500 MG tablet Take 1 tablet (500 mg total) by mouth 2 (two) times daily with a meal. 10/01/15   Margarita Mail, PA-C  oxyCODONE-acetaminophen (PERCOCET/ROXICET) 5-325 MG tablet Take 1 tablet by mouth every 8 (eight) hours as needed for severe pain. 02/24/16   Shary Decamp, PA-C      Allergies    Onion and Peanut-containing drug products    Review of Systems   Review of Systems  Physical Exam Updated Vital Signs BP (!) 140/93 (BP Location: Right Arm)   Pulse 92   Temp 98.9 F (37.2 C) (Oral)   Resp 18   Ht 5' 9"$  (1.753 m)   Wt 81.6 kg   SpO2 98%   BMI 26.58 kg/m  Physical Exam  ED Results / Procedures / Treatments   Labs (all labs ordered are listed, but only abnormal results are displayed) Labs Reviewed  COMPREHENSIVE METABOLIC PANEL - Abnormal; Notable for the following components:      Result Value   Sodium 133 (*)    Potassium 3.1 (*)    Chloride 93 (*)    Calcium 8.8 (*)    Total Protein 8.4 (*)    All other components within normal limits  CBC WITH DIFFERENTIAL/PLATELET    EKG None  Radiology No results found.  Procedures Procedures   {Document cardiac monitor, telemetry assessment procedure when appropriate:1}  Medications Ordered in ED Medications  vancomycin (VANCOCIN) IVPB 1000 mg/200 mL premix (0 mg Intravenous Stopped 05/29/22 2104)    ED Course/ Medical Decision Making/ A&P   {   Click here for ABCD2, HEART and other calculatorsREFRESH Note before signing :1}                          Medical Decision Making Amount and/or Complexity of Data Reviewed Labs: ordered. Radiology: ordered.  Risk Prescription drug management.  Patient with cellulitis to right lower leg that seems to be improving with Bactrim.  He will continue the Bactrim but we will add doxycycline and he will get a DVT study done tomorrow  {Document critical care time when appropriate:1} {Document review of labs and clinical decision tools ie heart score, Chads2Vasc2 etc:1}  {Document your independent review of radiology images, and any outside records:1} {Document your discussion with family members, caretakers, and with consultants:1} {Document social determinants of health affecting pt's care:1} {Document your decision making why or why not admission, treatments were needed:1} Final Clinical Impression(s) / ED Diagnoses Final diagnoses:  Right leg pain    Rx / DC Orders  ED Discharge Orders          Ordered    doxycycline (VIBRAMYCIN) 100 MG capsule  2 times daily        05/29/22 2108    [place] Extrem Low Right Ltd/Soft Tissue        05/29/22 2108

## 2022-05-29 NOTE — Discharge Instructions (Signed)
Follow-up tomorrow to get your ultrasound done on your leg.  If the ultrasound does not show a blood clot you should keep your leg elevated and just continue the antibiotics and follow-up with your family doctor

## 2022-05-29 NOTE — ED Triage Notes (Signed)
Pt complaining of rt lower leg swelling and becoming red and hot over the last 3 days. Was seen at [facility] told it was cellulitis given bactrim. He is worried about a blood clot as well.
